# Patient Record
Sex: Female | Born: 1995
Health system: Southern US, Community
[De-identification: ages and names within clinical notes are randomized; demographics above are authoritative.]

## PROBLEM LIST (undated history)

## (undated) DIAGNOSIS — H53009 Unspecified amblyopia, unspecified eye: Secondary | ICD-10-CM

## (undated) DIAGNOSIS — F4325 Adjustment disorder with mixed disturbance of emotions and conduct: Secondary | ICD-10-CM

## (undated) DIAGNOSIS — R6250 Unspecified lack of expected normal physiological development in childhood: Secondary | ICD-10-CM

## (undated) DIAGNOSIS — J309 Allergic rhinitis, unspecified: Secondary | ICD-10-CM

## (undated) DIAGNOSIS — D649 Anemia, unspecified: Secondary | ICD-10-CM

## (undated) DIAGNOSIS — F909 Attention-deficit hyperactivity disorder, unspecified type: Secondary | ICD-10-CM

## (undated) DIAGNOSIS — J4520 Mild intermittent asthma, uncomplicated: Secondary | ICD-10-CM

## (undated) HISTORY — DX: Adjustment disorder with mixed disturbance of emotions and conduct: F43.25

## (undated) HISTORY — DX: Mild intermittent asthma, uncomplicated: J45.20

## (undated) HISTORY — DX: Attention-deficit hyperactivity disorder, unspecified type: F90.9

## (undated) HISTORY — DX: Unspecified lack of expected normal physiological development in childhood: R62.50

## (undated) HISTORY — DX: Allergic rhinitis, unspecified: J30.9

## (undated) HISTORY — DX: Unspecified amblyopia, unspecified eye: H53.009

## (undated) HISTORY — DX: Anemia, unspecified: D64.9

## (undated) HISTORY — PX: NO PAST SURGERIES: SHX2092

---

## 2016-11-11 DIAGNOSIS — F909 Attention-deficit hyperactivity disorder, unspecified type: Secondary | ICD-10-CM | POA: Insufficient documentation

## 2016-11-11 DIAGNOSIS — D649 Anemia, unspecified: Secondary | ICD-10-CM | POA: Insufficient documentation

## 2016-11-11 DIAGNOSIS — J4520 Mild intermittent asthma, uncomplicated: Secondary | ICD-10-CM | POA: Insufficient documentation

## 2016-11-11 DIAGNOSIS — J309 Allergic rhinitis, unspecified: Secondary | ICD-10-CM | POA: Insufficient documentation

## 2016-12-24 ENCOUNTER — Ambulatory Visit: Payer: Self-pay | Admitting: Unknown Physician Specialty

## 2018-06-22 ENCOUNTER — Emergency Department: Payer: Self-pay

## 2018-06-22 ENCOUNTER — Emergency Department
Admission: EM | Admit: 2018-06-22 | Discharge: 2018-06-22 | Disposition: A | Discharge status: Home / Self Care | Payer: Self-pay | Attending: Emergency Medicine | Admitting: Emergency Medicine

## 2018-06-22 ENCOUNTER — Encounter: Payer: Self-pay | Admitting: Emergency Medicine

## 2018-06-22 ENCOUNTER — Other Ambulatory Visit: Payer: Self-pay

## 2018-06-22 DIAGNOSIS — N12 Tubulo-interstitial nephritis, not specified as acute or chronic: Secondary | ICD-10-CM

## 2018-06-22 DIAGNOSIS — N10 Acute tubulo-interstitial nephritis: Secondary | ICD-10-CM | POA: Insufficient documentation

## 2018-06-22 LAB — COMPREHENSIVE METABOLIC PANEL
ALT: 10 U/L (ref 0–44)
ANION GAP: 11 (ref 5–15)
AST: 19 U/L (ref 15–41)
Albumin: 4.5 g/dL (ref 3.5–5.0)
Alkaline Phosphatase: 60 U/L (ref 38–126)
BUN: 25 mg/dL — ABNORMAL HIGH (ref 6–20)
CALCIUM: 9.3 mg/dL (ref 8.9–10.3)
CHLORIDE: 104 mmol/L (ref 98–111)
CO2: 23 mmol/L (ref 22–32)
Creatinine, Ser: 0.75 mg/dL (ref 0.44–1.00)
GFR calc non Af Amer: >60 mL/min (ref >60)
Glucose, Bld: 102 mg/dL — ABNORMAL HIGH (ref 70–99)
Potassium: 3.9 mmol/L (ref 3.5–5.1)
SODIUM: 138 mmol/L (ref 135–145)
Total Bilirubin: 0.4 mg/dL (ref 0.3–1.2)
Total Protein: 7.6 g/dL (ref 6.5–8.1)

## 2018-06-22 LAB — URINALYSIS, COMPLETE (UACMP) WITH MICROSCOPIC
Bilirubin Urine: NEGATIVE
GLUCOSE, UA: NEGATIVE mg/dL
KETONES UR: NEGATIVE mg/dL
Nitrite: POSITIVE — AB
Protein, ur: 30 mg/dL — AB
SPECIFIC GRAVITY, URINE: 1.017 (ref 1.005–1.030)
pH: 5 (ref 5.0–8.0)

## 2018-06-22 LAB — CBC
HCT: 36.4 % (ref 36.0–46.0)
Hemoglobin: 11.2 g/dL — ABNORMAL LOW (ref 12.0–15.0)
MCH: 22.3 pg — AB (ref 26.0–34.0)
MCHC: 30.8 g/dL (ref 30.0–36.0)
MCV: 72.4 fL — ABNORMAL LOW (ref 80.0–100.0)
PLATELETS: 278 10*3/uL (ref 150–400)
RBC: 5.03 MIL/uL (ref 3.87–5.11)
RDW: 15.7 % — ABNORMAL HIGH (ref 11.5–15.5)
WBC: 17.7 10*3/uL — ABNORMAL HIGH (ref 4.0–10.5)
nRBC: 0 % (ref 0.0–0.2)

## 2018-06-22 LAB — INFLUENZA PANEL BY PCR (TYPE A & B)
Influenza A By PCR: NEGATIVE
Influenza B By PCR: NEGATIVE

## 2018-06-22 LAB — POCT PREGNANCY, URINE: PREG TEST UR: NEGATIVE

## 2018-06-22 MED ORDER — CEPHALEXIN 500 MG PO CAPS: 500.0000 mg | ORAL_CAPSULE | Freq: Four times a day (QID) | ORAL | 0 refills | Status: AC

## 2018-06-22 MED ORDER — SODIUM CHLORIDE 0.9 % IV SOLN
1.0000 g | Freq: Once | INTRAVENOUS | Status: AC
  Administered 2018-06-22: 1 g via INTRAVENOUS
  Filled 2018-06-22: qty 10

## 2018-06-22 MED ORDER — SODIUM CHLORIDE 0.9 % IV BOLUS
1000.0000 mL | Freq: Once | INTRAVENOUS | Status: AC
  Administered 2018-06-22: 1000 mL via INTRAVENOUS

## 2018-06-22 MED ORDER — ONDANSETRON 4 MG PO TBDP
ORAL_TABLET | ORAL | Status: AC
  Administered 2018-06-22: 4 mg via ORAL
  Filled 2018-06-22: qty 1

## 2018-06-22 MED ORDER — ONDANSETRON 4 MG PO TBDP
4.0000 mg | ORAL_TABLET | Freq: Once | ORAL | Status: AC
  Administered 2018-06-22: 4 mg via ORAL

## 2018-06-22 MED ORDER — IOPAMIDOL (ISOVUE-300) INJECTION 61%
100.0000 mL | Freq: Once | INTRAVENOUS | Status: AC | PRN
  Administered 2018-06-22: 100 mL via INTRAVENOUS

## 2018-06-22 MED ORDER — IOHEXOL 300 MG/ML  SOLN
30.0000 mL | Freq: Once | INTRAMUSCULAR | Status: AC | PRN
  Administered 2018-06-22: 30 mL via ORAL

## 2018-06-22 NOTE — ED Notes (Signed)
Patient transported to CT 

## 2018-06-22 NOTE — ED Provider Notes (Signed)
[facility] Emergency Department Provider Note   ____________________________________________   First MD Initiated Contact with Patient 06/22/18 [phone]     (approximate)  I have reviewed the triage vital signs and the nursing notes.   HISTORY  Chief Complaint Fever    HPI April Clay is a 22 y.o. female who reports mid back pain with fever developing last night.  Having the back pain.  She vomited once while they were drawing her blood.  She is somewhat tachycardic now.  Patient has a history of kidney infection in the past.  She is having a little bit of right lower quadrant pain on exam.  Pain is about a 4 out of 10.  Seems to be kind of an achy pain.  Both pains are worse with percussion.   Past Medical History:  Diagnosis Date  . ADHD   . Adjustment disorder with mixed disturbance of emotions and conduct    impulsively took pills in context of diagreement with significant other  . Allergic rhinitis   . Amblyopia   . Anemia   . Developmental delay    related to school  . Mild intermittent asthma     Patient Active Problem List   Diagnosis Date Noted  . Allergic rhinitis   . Mild intermittent asthma   . Anemia   . ADHD     History reviewed. No pertinent surgical history.  Prior to Admission medications   Medication Sig Start Date End Date Taking? Authorizing Provider  cephALEXin (KEFLEX) 500 MG capsule Take 1 capsule (500 mg total) by mouth 4 (four) times daily for 10 days. 06/22/18 07/02/18  Arnaldo Natal, MD    Allergies Patient has no known allergies.  Family History  Problem Relation Age of Onset  . Asthma Mother   . Sarcoidosis Mother   . Diabetes Maternal Grandmother   . Hypertension Maternal Grandmother   . Asthma Maternal Grandfather   . Diabetes Paternal Grandmother   . Cancer Paternal Grandfather        prostate    Social History Social History   Tobacco Use  . Smoking status: Not on file  Substance Use Topics    . Alcohol use: Not on file  . Drug use: Not on file    Review of Systems  Constitutional:  fever/chills Eyes: No visual changes. ENT: No sore throat. Cardiovascular: Denies chest pain. Respiratory: Denies shortness of breath. Gastrointestinal:abdominal pain.  No nausea, no vomiting.  No diarrhea.  No constipation. Genitourinary: Negative for dysuria. Musculoskeletal: Negative for back pain. Skin: Negative for rash. Neurological: Negative for headaches, focal weakness   ____________________________________________   PHYSICAL EXAM:  VITAL SIGNS: ED Triage Vitals  Enc Vitals Group     BP 06/22/18 0502 (!) 104/50     Pulse Rate 06/22/18 0504 (!) 115     Resp 06/22/18 0502 20     Temp 06/22/18 0502 98.6 F (37 C)     Temp Source 06/22/18 0502 Oral     SpO2 06/22/18 0502 98 %     Weight 06/22/18 0503 155 lb (70.3 kg)     Height 06/22/18 0503 5\' 8"  (1.727 m)     Head Circumference --      Peak Flow --      Pain Score 06/22/18 0503 7     Pain Loc --      Pain Edu? --      Excl. in GC? --     Constitutional: Alert and  oriented. Well appearing and in no acute distress. Eyes: Conjunctivae are normal.  Head: Atraumatic. Nose: No congestion/rhinnorhea. Mouth/Throat: Mucous membranes are moist.  Oropharynx non-erythematous. Neck: No stridor.  Cardiovascular: Normal rate, regular rhythm. Grossly normal heart sounds.  Good peripheral circulation. Respiratory: Normal respiratory effort.  No retractions. Lungs CTAB. Gastrointestinal: Soft tender to palpation percussion mildly in the right lower quadrant no distention. No abdominal bruits.  Right-sided CVA tenderness. Musculoskeletal: No lower extremity tenderness nor edema.  Neurologic:  Normal speech and language. No gross focal neurologic deficits are appreciated. No gait instability. Skin:  Skin is warm, dry and intact. No rash noted. Psychiatric: Mood and affect are normal. Speech and behavior are  normal.  ____________________________________________   LABS (all labs ordered are listed, but only abnormal results are displayed)  Labs Reviewed  CBC - Abnormal; Notable for the following components:      Result Value   WBC 17.7 (*)    Hemoglobin 11.2 (*)    MCV 72.4 (*)    MCH 22.3 (*)    RDW 15.7 (*)    All other components within normal limits  COMPREHENSIVE METABOLIC PANEL - Abnormal; Notable for the following components:   Glucose, Bld 102 (*)    BUN 25 (*)    All other components within normal limits  URINALYSIS, COMPLETE (UACMP) WITH MICROSCOPIC - Abnormal; Notable for the following components:   Color, Urine YELLOW (*)    APPearance CLOUDY (*)    Hgb urine dipstick SMALL (*)    Protein, ur 30 (*)    Nitrite POSITIVE (*)    Leukocytes, UA LARGE (*)    WBC, UA >50 (*)    Bacteria, UA RARE (*)    Non Squamous Epithelial PRESENT (*)    All other components within normal limits  INFLUENZA PANEL BY PCR (TYPE A & B)  POCT PREGNANCY, URINE   ____________________________________________  EKG   ____________________________________________  RADIOLOGY  ED MD interpretation: Chest x-ray read by radiology reviewed by me is negative.  There is mesenteric adenitis on the CT scan but otherwise nothing  Official radiology report(s): Dg Chest 2 View  Result Date: 06/22/2018 CLINICAL DATA:  22 year old female with back pain. EXAM: CHEST - 2 VIEW COMPARISON:  None. FINDINGS: The heart size and mediastinal contours are within normal limits. Both lungs are clear. The visualized skeletal structures are unremarkable. IMPRESSION: No active cardiopulmonary disease. Electronically Signed   By: Elgie Collard M.D.   On: 06/22/2018 05:50   Ct Abdomen Pelvis W Contrast  Result Date: 06/22/2018 CLINICAL DATA:  Abdominal pain and fever EXAM: CT ABDOMEN AND PELVIS WITH CONTRAST TECHNIQUE: Multidetector CT imaging of the abdomen and pelvis was performed using the standard protocol  following bolus administration of intravenous contrast. Oral contrast was also administered. CONTRAST:  ISOVUE-300 IOPAMIDOL (ISOVUE-300) INJECTION 61% COMPARISON:  None. FINDINGS: Lower chest: Lung bases are clear. Hepatobiliary: No focal liver lesions are apparent. Gallbladder wall is not appreciably thickened. There is no biliary duct dilatation. Pancreas: There is no pancreatic mass or inflammatory focus. Spleen: No splenic lesions are evident. Adrenals/Urinary Tract: Adrenals bilaterally appear unremarkable. Kidneys bilaterally show no evident mass or hydronephrosis on either side. There is no evident renal or ureteral calculus on either side. Urinary bladder is midline with wall thickness within normal limits. Stomach/Bowel: There is moderate stool throughout colon. There is no appreciable bowel wall or mesenteric thickening. There is no evident bowel obstruction. There is no free air or portal venous air. Vascular/Lymphatic:  No abdominal aortic aneurysm. No vascular lesions are evident. There is no appreciable adenopathy in the abdomen or pelvis by size criteria. There are occasional subcentimeter right abdominal lymph nodes. Reproductive: Uterus is anteverted. There is a presumed physiologic follicle in the left ovary measuring 2.4 x 2.0 cm. No other evident pelvic mass. Other: Appendix appears normal. There is no abscess or ascites in the abdomen or pelvis. Musculoskeletal: There are no blastic or lytic bone lesions. There is no intramuscular or abdominal wall lesion evident. IMPRESSION: 1. Appendix appears normal. No bowel obstruction. No abscess in the abdomen pelvis. There is moderate stool throughout the colon. 2. Subcentimeter lymph nodes are noted in the right abdomen. These lymph nodes are considered nonspecific. In the appropriate clinical setting, these lymph nodes potentially may indicate a degree of mesenteric adenitis. 3. No evident renal or ureteral calculus. No hydronephrosis. Urinary  bladder wall thickness normal. Electronically Signed   By: Bretta Bang III M.D.   On: 06/22/2018 09:46    ____________________________________________   PROCEDURES  Procedure(s) performed:   Procedures  Critical Care performed:   ____________________________________________   INITIAL IMPRESSION / ASSESSMENT AND PLAN / ED COURSE  Since the patient has right lower quadrant pain we will go ahead and get a CT scan.  I do not want to miss appendicitis.       ----------------------------------------- 10:14 AM on 06/22/2018 -----------------------------------------  Patient CT only shows mesenteric adenitis this will explain her pain she does have pyelonephritis clinically.  I will treat her with antibiotics and will let her go.  She feels well when I see her just now.   ____________________________________________   FINAL CLINICAL IMPRESSION(S) / ED DIAGNOSES  Final diagnoses:  Pyelonephritis     ED Discharge Orders         Ordered    cephALEXin (KEFLEX) 500 MG capsule  4 times daily     06/22/18 1013           Note:  This document was prepared using Dragon voice recognition software and may include unintentional dictation errors.    Arnaldo Natal, MD 06/22/18 1014

## 2018-06-22 NOTE — ED Triage Notes (Signed)
Pt reports at 3am she started shivering and thinks she was running a fever. Pt also reports pain in her back. Pt denies urinary sx's, cough, congestion or other upper respiratory sx's.

## 2018-08-25 NOTE — L&D Delivery Note (Signed)
Date of delivery: 04/17/2019 Estimated Date of Delivery: 04/10/19 Patient's last menstrual period was 06/24/2018. EGA: [redacted]w[redacted]d  Delivery Note At 5:06 AM a viable female was delivered via Vaginal, Spontaneous (Presentation: OA;  LOA).  APGAR: 8, 9; weight 7 lb 15 oz (3600 g).   Placenta status: spontaneous, intact.  Cord:  with the following complications: none.  Cord pH: NA  Called to see patient.  Mom pushed to deliver a viable female infant.  The head followed by shoulders, which delivered without difficulty, and the rest of the body.  Nuchal cord noted and reduced on the perineum.  Baby to mom's chest.  Cord clamped and cut after 3 min delay.  No cord blood obtained.  Placenta delivered spontaneously, intact, with a 3-vessel cord.   All counts correct.  Hemostasis obtained with IV pitocin and fundal massage.   Anesthesia: epidural  Episiotomy: None Lacerations: None Suture Repair: none Est. Blood Loss (mL): 150  Mom to postpartum.  Baby to Couplet care / Skin to Skin.  Rod Can, CNM 04/17/2019, 5:42 AM

## 2018-10-13 ENCOUNTER — Other Ambulatory Visit (HOSPITAL_COMMUNITY)
Admission: RE | Admit: 2018-10-13 | Discharge: 2018-10-13 | Disposition: A | Discharge status: Home / Self Care | Payer: Medicaid Other | Source: Ambulatory Visit | Attending: Obstetrics & Gynecology | Admitting: Obstetrics & Gynecology

## 2018-10-13 ENCOUNTER — Ambulatory Visit (INDEPENDENT_AMBULATORY_CARE_PROVIDER_SITE_OTHER): Payer: Medicaid Other | Admitting: Obstetrics & Gynecology

## 2018-10-13 ENCOUNTER — Encounter: Payer: Self-pay | Admitting: Obstetrics & Gynecology

## 2018-10-13 VITALS — BP 120/60 | Wt 156.0 lb

## 2018-10-13 DIAGNOSIS — Z3482 Encounter for supervision of other normal pregnancy, second trimester: Secondary | ICD-10-CM | POA: Diagnosis not present

## 2018-10-13 DIAGNOSIS — Z3A15 15 weeks gestation of pregnancy: Secondary | ICD-10-CM

## 2018-10-13 DIAGNOSIS — Z113 Encounter for screening for infections with a predominantly sexual mode of transmission: Secondary | ICD-10-CM | POA: Insufficient documentation

## 2018-10-13 DIAGNOSIS — N926 Irregular menstruation, unspecified: Secondary | ICD-10-CM

## 2018-10-13 DIAGNOSIS — Z124 Encounter for screening for malignant neoplasm of cervix: Secondary | ICD-10-CM

## 2018-10-13 MED ORDER — VITAFOL GUMMIES 3.33-0.333-34.8 MG PO CHEW: 1.0000 | CHEWABLE_TABLET | Freq: Two times a day (BID) | ORAL | 8 refills | Status: AC

## 2018-10-13 NOTE — Progress Notes (Signed)
10/13/2018   Chief Complaint: Missed period  Transfer of Care Patient: no  History of Present Illness: Ms. April Clay is a 23 y.o. [id number] [redacted]w[redacted]d based on Patient's last menstrual period was 06/24/2018. with an Estimated Date of Delivery: 03/31/19, with the above CC.   Her periods were: irregular periods.  Thinks she is 13 weeks.  Did not have nausea or bleeding or pain She was using no method when she conceived.  She has Negative signs or symptoms of nausea/vomiting of pregnancy. She has Negative signs or symptoms of miscarriage or preterm labor She identifies Negative Zika risk factors for her and her partner On any different medications around the time she conceived/early pregnancy: No  History of varicella: Yes   ROS: A 12-point review of systems was performed and negative, except as stated in the above HPI.  OBGYN History: As per HPI. OB History  Gravida Para Term Preterm AB Living  3 2 2     2   SAB TAB Ectopic Multiple Live Births               # Outcome Date GA Lbr Len/2nd Weight Sex Delivery Anes PTL Lv  3 Current           2 Term 06/12/17 [redacted]w[redacted]d  8 lb 9.6 oz (3.901 kg) M Vag-Spont     1 Term 01/23/15 [redacted]w[redacted]d  8 lb 8 oz (3.856 kg) M Vag-Spont       Any issues with any prior pregnancies: no Any prior children are healthy, doing well, without any problems or issues: yes History of pap smears: No.    Past Medical History: Past Medical History:  Diagnosis Date  . ADHD   . Adjustment disorder with mixed disturbance of emotions and conduct    impulsively took pills in context of diagreement with significant other  . Allergic rhinitis   . Amblyopia   . Anemia   . Developmental delay    related to school  . Mild intermittent asthma     Past Surgical History: History reviewed. No pertinent surgical history.  Family History:  Family History  Problem Relation Age of Onset  . Asthma Mother   . Sarcoidosis Mother   . Diabetes Maternal Grandmother   . Hypertension Maternal  Grandmother   . Asthma Maternal Grandfather   . Diabetes Paternal Grandmother   . Cancer Paternal Grandfather        prostate   She denies any female cancers, bleeding or blood clotting disorders.  She denies any history of mental retardation, birth defects or genetic disorders in her or the FOB's history  Social History:  Social History   Socioeconomic History  . Marital status: Single    Spouse name: Not on file  . Number of children: Not on file  . Years of education: Not on file  . Highest education level: Not on file  Occupational History  . Not on file  Social Needs  . Financial resource strain: Not on file  . Food insecurity:    Worry: Not on file    Inability: Not on file  . Transportation needs:    Medical: Not on file    Non-medical: Not on file  Tobacco Use  . Smoking status: Never Smoker  . Smokeless tobacco: Never Used  Substance and Sexual Activity  . Alcohol use: Never    Frequency: Never  . Drug use: Never  . Sexual activity: Yes    Birth control/protection: None  Lifestyle  . Physical activity:  Days per week: Not on file    Minutes per session: Not on file  . Stress: Not on file  Relationships  . Social connections:    Talks on phone: Not on file    Gets together: Not on file    Attends religious service: Not on file    Active member of club or organization: Not on file    Attends meetings of clubs or organizations: Not on file    Relationship status: Not on file  . Intimate partner violence:    Fear of current or ex partner: Not on file    Emotionally abused: Not on file    Physically abused: Not on file    Forced sexual activity: Not on file  Other Topics Concern  . Not on file  Social History Narrative  . Not on file   Any pets in the household: no  Allergy: No Known Allergies  Current Outpatient Medications:  Current Outpatient Medications:  .  Prenatal Vit-Fe Phos-FA-Omega (VITAFOL GUMMIES) 3.33-0.333-34.8 MG CHEW, Chew 1  tablet by mouth 2 (two) times daily., Disp: 60 tablet, Rfl: 8   Physical Exam:   BP 120/60   Wt 156 lb (70.8 kg)   LMP 06/24/2018 Comment: neg preg test 06/22/18  BMI 23.72 kg/m  Body mass index is 23.72 kg/m. Constitutional: Well nourished, well developed female in no acute distress.  Neck:  Supple, normal appearance, and no thyromegaly  Cardiovascular: S1, S2 normal, no murmur, rub or gallop, regular rate and rhythm Respiratory:  Clear to auscultation bilateral. Normal respiratory effort Abdomen: positive bowel sounds and no masses, hernias; diffusely non tender to palpation, non distended Breasts: breasts appear normal, no suspicious masses, no skin or nipple changes or axillary nodes. Neuro/Psych:  Normal mood and affect.  Skin:  Warm and dry.  Lymphatic:  No inguinal lymphadenopathy.   Pelvic exam: is not limited by body habitus EGBUS: within normal limits, Vagina: within normal limits and with no blood in the vault, Cervix: normal appearing cervix without discharge or lesions, closed/long/high, Uterus:  enlarged: 13 weeks, and Adnexa:  not evaluated  Assessment: Ms. April Clay is a 23 y.o. [id number] [redacted]w[redacted]d based on Patient's last menstrual period was 06/24/2018. with an Estimated Date of Delivery: 03/31/19,  for prenatal care.  Plan:  1) Avoid alcoholic beverages. 2) Patient encouraged not to smoke.  3) Discontinue the use of all non-medicinal drugs and chemicals.  4) Take prenatal vitamins daily.  5) Seatbelt use advised 6) Nutrition, food safety (fish, cheese advisories, and high nitrite foods) and exercise discussed. 7) Hospital and practice style delivering at [facility] discussed  8) Patient is asked about travel to areas at risk for the Zika virus, and counseled to avoid travel and exposure to mosquitoes or sexual partners who may have themselves been exposed to the virus. Testing is discussed, and will be ordered as appropriate.  9) Childbirth classes at [facility] advised 10) Genetic  Screening, such as with 1st Trimester Screening, cell free fetal DNA, AFP testing, and Ultrasound, as well as with amniocentesis and CVS as appropriate, is discussed with patient. She plans to have not genetic testing this pregnancy.  Problem list reviewed and updated.  Annamarie Major, MD, [facility], [facility] 10/13/2018  2:51 PM

## 2018-10-13 NOTE — Patient Instructions (Signed)
First Trimester of Pregnancy  The first trimester of pregnancy is from week 1 until the end of week 13 (months 1 through 3). A week after a sperm fertilizes an egg, the egg will implant on the wall of the uterus. This embryo will begin to develop into a baby. Genes from you and your partner will form the baby. The female genes will determine whether the baby will be a boy or a girl. At 6-8 weeks, the eyes and face will be formed, and the heartbeat can be seen on ultrasound. At the end of 12 weeks, all the baby's organs will be formed.  Now that you are pregnant, you will want to do everything you can to have a healthy baby. Two of the most important things are to get good prenatal care and to follow your health care provider's instructions. Prenatal care is all the medical care you receive before the baby's birth. This care will help prevent, find, and treat any problems during the pregnancy and childbirth.  Body changes during your first trimester  Your body goes through many changes during pregnancy. The changes vary from woman to woman.   You may gain or lose a couple of pounds at first.   You may feel sick to your stomach (nauseous) and you may throw up (vomit). If the vomiting is uncontrollable, call your health care provider.   You may tire easily.   You may develop headaches that can be relieved by medicines. All medicines should be approved by your health care provider.   You may urinate more often. Painful urination may mean you have a bladder infection.   You may develop heartburn as a result of your pregnancy.   You may develop constipation because certain hormones are causing the muscles that push stool through your intestines to slow down.   You may develop hemorrhoids or swollen veins (varicose veins).   Your breasts may begin to grow larger and become tender. Your nipples may stick out more, and the tissue that surrounds them (areola) may become darker.   Your gums may bleed and may be  sensitive to brushing and flossing.   Dark spots or blotches (chloasma, mask of pregnancy) may develop on your face. This will likely fade after the baby is born.   Your menstrual periods will stop.   You may have a loss of appetite.   You may develop cravings for certain kinds of food.   You may have changes in your emotions from day to day, such as being excited to be pregnant or being concerned that something may go wrong with the pregnancy and baby.   You may have more vivid and strange dreams.   You may have changes in your hair. These can include thickening of your hair, rapid growth, and changes in texture. Some women also have hair loss during or after pregnancy, or hair that feels dry or thin. Your hair will most likely return to normal after your baby is born.  What to expect at prenatal visits  During a routine prenatal visit:   You will be weighed to make sure you and the baby are growing normally.   Your blood pressure will be taken.   Your abdomen will be measured to track your baby's growth.   The fetal heartbeat will be listened to between weeks 10 and 14 of your pregnancy.   Test results from any previous visits will be discussed.  Your health care provider may ask you:     How you are feeling.   If you are feeling the baby move.   If you have had any abnormal symptoms, such as leaking fluid, bleeding, severe headaches, or abdominal cramping.   If you are using any tobacco products, including cigarettes, chewing tobacco, and electronic cigarettes.   If you have any questions.  Other tests that may be performed during your first trimester include:   Blood tests to find your blood type and to check for the presence of any previous infections. The tests will also be used to check for low iron levels (anemia) and protein on red blood cells (Rh antibodies). Depending on your risk factors, or if you previously had diabetes during pregnancy, you may have tests to check for high blood sugar  that affects pregnant women (gestational diabetes).   Urine tests to check for infections, diabetes, or protein in the urine.   An ultrasound to confirm the proper growth and development of the baby.   Fetal screens for spinal cord problems (spina bifida) and Down syndrome.   HIV (human immunodeficiency virus) testing. Routine prenatal testing includes screening for HIV, unless you choose not to have this test.   You may need other tests to make sure you and the baby are doing well.  Follow these instructions at home:  Medicines   Follow your health care provider's instructions regarding medicine use. Specific medicines may be either safe or unsafe to take during pregnancy.   Take a prenatal vitamin that contains at least 600 micrograms (mcg) of folic acid.   If you develop constipation, try taking a stool softener if your health care provider approves.  Eating and drinking     Eat a balanced diet that includes fresh fruits and vegetables, whole grains, good sources of protein such as meat, eggs, or tofu, and low-fat dairy. Your health care provider will help you determine the amount of weight gain that is right for you.   Avoid raw meat and uncooked cheese. These carry germs that can cause birth defects in the baby.   Eating four or five small meals rather than three large meals a day may help relieve nausea and vomiting. If you start to feel nauseous, eating a few soda crackers can be helpful. Drinking liquids between meals, instead of during meals, also seems to help ease nausea and vomiting.   Limit foods that are high in fat and processed sugars, such as fried and sweet foods.   To prevent constipation:  ? Eat foods that are high in fiber, such as fresh fruits and vegetables, whole grains, and beans.  ? Drink enough fluid to keep your urine clear or pale yellow.  Activity   Exercise only as directed by your health care provider. Most women can continue their usual exercise routine during  pregnancy. Try to exercise for 30 minutes at least 5 days a week. Exercising will help you:  ? Control your weight.  ? Stay in shape.  ? Be prepared for labor and delivery.   Experiencing pain or cramping in the lower abdomen or lower back is a good sign that you should stop exercising. Check with your health care provider before continuing with normal exercises.   Try to avoid standing for long periods of time. Move your legs often if you must stand in one place for a long time.   Avoid heavy lifting.   Wear low-heeled shoes and practice good posture.   You may continue to have sex unless your health care   provider tells you not to.  Relieving pain and discomfort   Wear a good support bra to relieve breast tenderness.   Take warm sitz baths to soothe any pain or discomfort caused by hemorrhoids. Use hemorrhoid cream if your health care provider approves.   Rest with your legs elevated if you have leg cramps or low back pain.   If you develop varicose veins in your legs, wear support hose. Elevate your feet for 15 minutes, 3-4 times a day. Limit salt in your diet.  Prenatal care   Schedule your prenatal visits by the twelfth week of pregnancy. They are usually scheduled monthly at first, then more often in the last 2 months before delivery.   Write down your questions. Take them to your prenatal visits.   Keep all your prenatal visits as told by your health care provider. This is important.  Safety   Wear your seat belt at all times when driving.   Make a list of emergency phone numbers, including numbers for family, friends, the hospital, and police and fire departments.  General instructions   Ask your health care provider for a referral to a local prenatal education class. Begin classes no later than the beginning of month 6 of your pregnancy.   Ask for help if you have counseling or nutritional needs during pregnancy. Your health care provider can offer advice or refer you to specialists for help  with various needs.   Do not use hot tubs, steam rooms, or saunas.   Do not douche or use tampons or scented sanitary pads.   Do not cross your legs for long periods of time.   Avoid cat litter boxes and soil used by cats. These carry germs that can cause birth defects in the baby and possibly loss of the fetus by miscarriage or stillbirth.   Avoid all smoking, herbs, alcohol, and medicines not prescribed by your health care provider. Chemicals in these products affect the formation and growth of the baby.   Do not use any products that contain nicotine or tobacco, such as cigarettes and e-cigarettes. If you need help quitting, ask your health care provider. You may receive counseling support and other resources to help you quit.   Schedule a dentist appointment. At home, brush your teeth with a soft toothbrush and be gentle when you floss.  Contact a health care provider if:   You have dizziness.   You have mild pelvic cramps, pelvic pressure, or nagging pain in the abdominal area.   You have persistent nausea, vomiting, or diarrhea.   You have a bad smelling vaginal discharge.   You have pain when you urinate.   You notice increased swelling in your face, hands, legs, or ankles.   You are exposed to fifth disease or chickenpox.   You are exposed to German measles (rubella) and have never had it.  Get help right away if:   You have a fever.   You are leaking fluid from your vagina.   You have spotting or bleeding from your vagina.   You have severe abdominal cramping or pain.   You have rapid weight gain or loss.   You vomit blood or material that looks like coffee grounds.   You develop a severe headache.   You have shortness of breath.   You have any kind of trauma, such as from a fall or a car accident.  Summary   The first trimester of pregnancy is from week 1 until   the end of week 13 (months 1 through 3).   Your body goes through many changes during pregnancy. The changes vary from  woman to woman.   You will have routine prenatal visits. During those visits, your health care provider will examine you, discuss any test results you may have, and talk with you about how you are feeling.  This information is not intended to replace advice given to you by your health care provider. Make sure you discuss any questions you have with your health care provider.  Document Released: 08/05/2001 Document Revised: 07/23/2016 Document Reviewed: 07/23/2016  [employer]vier Interactive Patient Education  2019 [employer]

## 2018-10-14 LAB — DRUG SCREEN, URINE
Amphetamines, Urine: NEGATIVE ng/mL
BARBITURATE SCREEN URINE: NEGATIVE ng/mL
Benzodiazepine Quant, Ur: NEGATIVE ng/mL
CANNABINOID QUANT UR: NEGATIVE ng/mL
Cocaine (Metab.): NEGATIVE ng/mL
Opiate Quant, Ur: NEGATIVE ng/mL
PCP Quant, Ur: NEGATIVE ng/mL

## 2018-10-15 LAB — RPR+RH+ABO+RUB AB+AB SCR+CB...
ANTIBODY SCREEN: NEGATIVE
HEMATOCRIT: 34.3 % (ref 34.0–46.6)
HEMOGLOBIN: 10.8 g/dL — AB (ref 11.1–15.9)
HIV Screen 4th Generation wRfx: NONREACTIVE
Hepatitis B Surface Ag: NEGATIVE
MCH: 23.1 pg — AB (ref 26.6–33.0)
MCHC: 31.5 g/dL (ref 31.5–35.7)
MCV: 73 fL — AB (ref 79–97)
Platelets: 396 10*3/uL (ref 150–450)
RBC: 4.68 x10E6/uL (ref 3.77–5.28)
RDW: 16.2 % — ABNORMAL HIGH (ref 11.7–15.4)
RH TYPE: POSITIVE
RPR Ser Ql: NONREACTIVE
Rubella Antibodies, IGG: 14.2 index (ref >0.99)
Varicella zoster IgG: 169 index (ref >165)
WBC: 6.9 10*3/uL (ref 3.4–10.8)

## 2018-10-15 LAB — HEMOGLOBINOPATHY EVALUATION
HGB C: 0 %
HGB S: 0 %
HGB VARIANT: 0 %
Hemoglobin A2 Quantitation: 2 % (ref 1.8–3.2)
Hemoglobin F Quantitation: 0 % (ref 0.0–2.0)
Hgb A: 98 % (ref 96.4–98.8)

## 2018-10-15 LAB — CYTOLOGY - PAP
Chlamydia: NEGATIVE
DIAGNOSIS: NEGATIVE
Neisseria Gonorrhea: NEGATIVE

## 2018-10-15 LAB — URINE CULTURE

## 2018-10-20 ENCOUNTER — Other Ambulatory Visit: Payer: Self-pay | Admitting: Obstetrics & Gynecology

## 2018-10-20 ENCOUNTER — Ambulatory Visit (INDEPENDENT_AMBULATORY_CARE_PROVIDER_SITE_OTHER): Payer: Medicaid Other | Admitting: Maternal Newborn

## 2018-10-20 ENCOUNTER — Encounter: Payer: Self-pay | Admitting: Maternal Newborn

## 2018-10-20 ENCOUNTER — Ambulatory Visit (INDEPENDENT_AMBULATORY_CARE_PROVIDER_SITE_OTHER): Payer: Medicaid Other

## 2018-10-20 VITALS — BP 122/74 | Wt 157.0 lb

## 2018-10-20 DIAGNOSIS — Z3689 Encounter for other specified antenatal screening: Secondary | ICD-10-CM

## 2018-10-20 DIAGNOSIS — B373 Candidiasis of vulva and vagina: Secondary | ICD-10-CM

## 2018-10-20 DIAGNOSIS — N926 Irregular menstruation, unspecified: Secondary | ICD-10-CM

## 2018-10-20 DIAGNOSIS — Z3687 Encounter for antenatal screening for uncertain dates: Secondary | ICD-10-CM

## 2018-10-20 DIAGNOSIS — Z3480 Encounter for supervision of other normal pregnancy, unspecified trimester: Secondary | ICD-10-CM | POA: Insufficient documentation

## 2018-10-20 DIAGNOSIS — Z3A15 15 weeks gestation of pregnancy: Secondary | ICD-10-CM

## 2018-10-20 DIAGNOSIS — B3731 Acute candidiasis of vulva and vagina: Secondary | ICD-10-CM

## 2018-10-20 DIAGNOSIS — Z3482 Encounter for supervision of other normal pregnancy, second trimester: Secondary | ICD-10-CM

## 2018-10-20 MED ORDER — FLUCONAZOLE 150 MG PO TABS: 150.0000 mg | ORAL_TABLET | Freq: Once | ORAL | 0 refills | Status: AC

## 2018-10-20 NOTE — Progress Notes (Signed)
    Routine Prenatal Care Visit  Subjective  April Clay is a 23 y.o. G3P2002 at [redacted]w[redacted]d being seen today for ongoing prenatal care.  She is currently monitored for the following issues for this low-risk pregnancy and has Allergic rhinitis; Mild intermittent asthma; Anemia; ADHD; and Supervision of other normal pregnancy, antepartum on their problem list.  ----------------------------------------------------------------------------------- Patient reports headache.   Vag. Bleeding: None.   ----------------------------------------------------------------------------------- The following portions of the patient's history were reviewed and updated as appropriate: allergies, current medications, past family history, past medical history, past social history, past surgical history and problem list. Problem list updated.  Objective  Blood pressure 122/74, weight 157 lb (71.2 kg), last menstrual period 06/24/2018. Pregravid weight Pregravid weight not on file Total Weight Gain Not found.  Fetal Status: Fetal Heart Rate (bpm): 146         General:  Alert, oriented and cooperative. Patient is in no acute distress.  Skin: Skin is warm and dry. No rash noted.   Cardiovascular: Normal heart rate noted  Respiratory: Normal respiratory effort, no problems with respiration noted  Abdomen: Soft, gravid, appropriate for gestational age. Pain/Pressure: Absent     Pelvic:  Cervical exam deferred        Extremities: Normal range of motion.     Mental Status: Normal mood and affect. Normal behavior. Normal judgment and thought content.    Assessment   23 y.o. [id number] at [redacted]w[redacted]d, EDD 04/10/2019 by Ultrasound presenting for a routine prenatal visit.  Plan   pregnancy3 Problems (from 06/24/18 to present)    Problem Noted Resolved   Supervision of other normal pregnancy, antepartum 10/20/2018 by Oswaldo Conroy, CNM No   Overview Signed 10/20/2018 11:22 AM by Oswaldo Conroy, CNM    [facility] Prenatal  Labs  Dating  Blood type: A/Positive/-- (02/19 1502)   Genetic Screen 1 Screen:    AFP:     Quad:     NIPS: Antibody:Negative (02/19 1502)  Anatomic [place]  Rubella: 14.20 (02/19 1502) Varicella: Immune  GTT Early:               Third trimester:  RPR: Non Reactive (02/19 1502)   Rhogam  HBsAg: Negative (02/19 1502)   TDaP vaccine                       Flu Shot: HIV: Non Reactive (02/19 1502)   Baby Food                                GBS:   Contraception  Pap: 10/13/2018, NILM  CBB     CS/VBAC    Support Person               Dating scan today shows singleton IUP, FHR 146, at [redacted]w[redacted]d.  EDD will be changed to Ultrasound EDD of 04/10/2019  Discussed lab results. Rx sent for yeast seen on Pap as she is having some thick discharge. Encouraged taking prenatal vitamins with Fe for mild anemia.  Advised Tylenol for headache.  Please refer to After Visit Summary for other counseling recommendations.   Return in about 4 weeks (around 11/17/2018) for ROB and anatomy scan .  Marcelyn Bruins, CNM 10/20/2018

## 2018-10-20 NOTE — Progress Notes (Signed)
No vb. No lof. U/s today.  

## 2018-10-21 ENCOUNTER — Telehealth: Payer: Self-pay | Admitting: Advanced Practice Midwife

## 2018-10-21 NOTE — Telephone Encounter (Signed)
Patient's next appt: Thursday, 11/18/18 @ 10:30am ultrasound and 11:10am rob w/ Erskine Squibb. No answer, no v/m. No Mychart.

## 2018-10-22 NOTE — Patient Instructions (Signed)

## 2018-11-02 NOTE — Telephone Encounter (Signed)
Patient is aware of appointment °

## 2018-11-18 ENCOUNTER — Ambulatory Visit (INDEPENDENT_AMBULATORY_CARE_PROVIDER_SITE_OTHER): Payer: Medicaid Other | Admitting: Advanced Practice Midwife

## 2018-11-18 ENCOUNTER — Other Ambulatory Visit: Payer: Self-pay

## 2018-11-18 ENCOUNTER — Ambulatory Visit (INDEPENDENT_AMBULATORY_CARE_PROVIDER_SITE_OTHER): Payer: Medicaid Other

## 2018-11-18 VITALS — BP 118/76 | Wt 162.0 lb

## 2018-11-18 DIAGNOSIS — Z130 Encounter for screening for diseases of the blood and blood-forming organs and certain disorders involving the immune mechanism: Secondary | ICD-10-CM

## 2018-11-18 DIAGNOSIS — Z363 Encounter for antenatal screening for malformations: Secondary | ICD-10-CM | POA: Diagnosis not present

## 2018-11-18 DIAGNOSIS — O99519 Diseases of the respiratory system complicating pregnancy, unspecified trimester: Secondary | ICD-10-CM

## 2018-11-18 DIAGNOSIS — Z113 Encounter for screening for infections with a predominantly sexual mode of transmission: Secondary | ICD-10-CM

## 2018-11-18 DIAGNOSIS — Z3689 Encounter for other specified antenatal screening: Secondary | ICD-10-CM

## 2018-11-18 DIAGNOSIS — O99512 Diseases of the respiratory system complicating pregnancy, second trimester: Secondary | ICD-10-CM

## 2018-11-18 DIAGNOSIS — O26892 Other specified pregnancy related conditions, second trimester: Secondary | ICD-10-CM

## 2018-11-18 DIAGNOSIS — Z3A19 19 weeks gestation of pregnancy: Secondary | ICD-10-CM

## 2018-11-18 DIAGNOSIS — J45909 Unspecified asthma, uncomplicated: Secondary | ICD-10-CM

## 2018-11-18 DIAGNOSIS — Z3480 Encounter for supervision of other normal pregnancy, unspecified trimester: Secondary | ICD-10-CM

## 2018-11-18 DIAGNOSIS — Z131 Encounter for screening for diabetes mellitus: Secondary | ICD-10-CM

## 2018-11-18 DIAGNOSIS — R51 Headache: Secondary | ICD-10-CM

## 2018-11-18 MED ORDER — ALBUTEROL SULFATE HFA 108 (90 BASE) MCG/ACT IN AERS: 1.0000 | INHALATION_SPRAY | Freq: Four times a day (QID) | RESPIRATORY_TRACT | 3 refills | Status: DC | PRN

## 2018-11-18 MED ORDER — BUTALBITAL-APAP-CAFFEINE 50-325-40 MG PO CAPS: 1.0000 | ORAL_CAPSULE | Freq: Four times a day (QID) | ORAL | 0 refills | Status: DC | PRN

## 2018-11-18 NOTE — Progress Notes (Signed)
No vb. No lof.  Pt having HA's frequently. U/s today.

## 2018-11-18 NOTE — Progress Notes (Signed)
Routine Prenatal Care Visit  Subjective  April Clay is a 23 y.o. G3P2002 at [redacted]w[redacted]d being seen today for ongoing prenatal care.  She is currently monitored for the following issues for this low-risk pregnancy and has Allergic rhinitis; Mild intermittent asthma; Anemia; ADHD; and Supervision of other normal pregnancy, antepartum on their problem list.  ----------------------------------------------------------------------------------- Patient reports headaches since the beginning of the pregnancy. She admits stopping soda intake at the beginning of the pregnancy. She requests refill of inhaler as her past prescription has expired and she has no refills. She uses it occasionally.    . Vag. Bleeding: None.  Movement: Present. Denies leaking of fluid.  ----------------------------------------------------------------------------------- The following portions of the patient's history were reviewed and updated as appropriate: allergies, current medications, past family history, past medical history, past social history, past surgical history and problem list. Problem list updated.   Objective  Blood pressure 118/76, weight 162 lb (73.5 kg), last menstrual period 06/24/2018. Pregravid weight 155 lb (70.3 kg) Total Weight Gain 7 lb (3.175 kg) Urinalysis: Urine Protein    Urine Glucose    Fetal Status: Fetal Heart Rate (bpm): 143   Movement: Present     Anatomy scan incomplete spine and LVOT, anterior placenta, female Choroid plexus seen Right: 0.9 x 0.6, Left: 0.8 x 0.6  General:  Alert, oriented and cooperative. Patient is in no acute distress.  Skin: Skin is warm and dry. No rash noted.   Cardiovascular: Normal heart rate noted  Respiratory: Normal respiratory effort, no problems with respiration noted  Abdomen: Soft, gravid, appropriate for gestational age. Pain/Pressure: Absent     Pelvic:  Cervical exam deferred        Extremities: Normal range of motion.     Mental Status: Normal mood and  affect. Normal behavior. Normal judgment and thought content.   Assessment   23 y.o. [id number] at [redacted]w[redacted]d by  04/10/2019, by Ultrasound presenting for routine prenatal visit  Plan   pregnancy3 Problems (from 06/24/18 to present)    Problem Noted Resolved   Supervision of other normal pregnancy, antepartum 10/20/2018 by Oswaldo Conroy, CNM No   Overview Signed 10/20/2018 11:22 AM by Oswaldo Conroy, CNM    [facility] Prenatal Labs  Dating  Blood type: A/Positive/-- (02/19 1502)   Genetic Screen 1 Screen:    AFP:     Quad:     NIPS: Antibody:Negative (02/19 1502)  Anatomic [place]  Rubella: 14.20 (02/19 1502) Varicella: Immune  GTT Early:               Third trimester:  RPR: Non Reactive (02/19 1502)   Rhogam  HBsAg: Negative (02/19 1502)   TDaP vaccine                       Flu Shot: HIV: Non Reactive (02/19 1502)   Baby Food                                GBS:   Contraception  Pap: 10/13/2018, NILM  CBB     CS/VBAC    Support Person                  Preterm labor symptoms and general obstetric precautions including but not limited to vaginal bleeding, contractions, leaking of fluid and fetal movement were reviewed in detail with the patient.   Rx albuterol Try adding a cup of caffeineated beverage  daily (avoid sugar sweetened) prior to trying Fioricet  Return in about 8 weeks (around 01/13/2019) for follow up anatomy, 28 wk labs, ROB.    Tresea Mall, CNM 11/18/2018 11:38 AM

## 2019-01-13 ENCOUNTER — Ambulatory Visit (INDEPENDENT_AMBULATORY_CARE_PROVIDER_SITE_OTHER): Payer: Medicaid Other

## 2019-01-13 ENCOUNTER — Ambulatory Visit (INDEPENDENT_AMBULATORY_CARE_PROVIDER_SITE_OTHER): Payer: Medicaid Other | Admitting: Maternal Newborn

## 2019-01-13 ENCOUNTER — Other Ambulatory Visit: Payer: Medicaid Other

## 2019-01-13 ENCOUNTER — Encounter: Payer: Self-pay | Admitting: Maternal Newborn

## 2019-01-13 ENCOUNTER — Other Ambulatory Visit: Payer: Self-pay

## 2019-01-13 ENCOUNTER — Encounter: Payer: Medicaid Other | Admitting: Advanced Practice Midwife

## 2019-01-13 VITALS — BP 130/60 | Wt 174.0 lb

## 2019-01-13 DIAGNOSIS — Z3480 Encounter for supervision of other normal pregnancy, unspecified trimester: Secondary | ICD-10-CM

## 2019-01-13 DIAGNOSIS — Z113 Encounter for screening for infections with a predominantly sexual mode of transmission: Secondary | ICD-10-CM

## 2019-01-13 DIAGNOSIS — Z130 Encounter for screening for diseases of the blood and blood-forming organs and certain disorders involving the immune mechanism: Secondary | ICD-10-CM

## 2019-01-13 DIAGNOSIS — O0932 Supervision of pregnancy with insufficient antenatal care, second trimester: Secondary | ICD-10-CM

## 2019-01-13 DIAGNOSIS — Z362 Encounter for other antenatal screening follow-up: Secondary | ICD-10-CM | POA: Diagnosis not present

## 2019-01-13 DIAGNOSIS — Z131 Encounter for screening for diabetes mellitus: Secondary | ICD-10-CM

## 2019-01-13 DIAGNOSIS — O0930 Supervision of pregnancy with insufficient antenatal care, unspecified trimester: Secondary | ICD-10-CM

## 2019-01-13 DIAGNOSIS — Z369 Encounter for antenatal screening, unspecified: Secondary | ICD-10-CM

## 2019-01-13 DIAGNOSIS — Z3A27 27 weeks gestation of pregnancy: Secondary | ICD-10-CM

## 2019-01-13 LAB — POCT URINALYSIS DIPSTICK OB
Glucose, UA: NEGATIVE
POC,PROTEIN,UA: NEGATIVE

## 2019-01-13 NOTE — Progress Notes (Signed)
Follow up Anatomy/ROB/1 hr GTT- no concerns

## 2019-01-13 NOTE — Progress Notes (Signed)
    Routine Prenatal Care Visit  Subjective  April Clay is a 23 y.o. G3P2002 at [redacted]w[redacted]d being seen today for ongoing prenatal care.  She is currently monitored for the following issues for this low-risk pregnancy and has Allergic rhinitis; Mild intermittent asthma; Anemia; ADHD; Supervision of other normal pregnancy, antepartum; and Limited prenatal care, antepartum on their problem list.  ----------------------------------------------------------------------------------- Patient reports no complaints.   Contractions: Not present. Vag. Bleeding: None.  Movement: Present. No leaking of fluid.  ----------------------------------------------------------------------------------- The following portions of the patient's history were reviewed and updated as appropriate: allergies, current medications, past family history, past medical history, past social history, past surgical history and problem list. Problem list updated.  Objective  Blood pressure 130/60, weight 174 lb (78.9 kg), last menstrual period 06/24/2018. Pregravid weight 155 lb (70.3 kg) Total Weight Gain 19 lb (8.618 kg) Urinalysis: Urine dipstick shows negative for glucose, protein. Fetal Status: Fetal Heart Rate (bpm): 141   Movement: Present     General:  Alert, oriented and cooperative. Patient is in no acute distress.  Skin: Skin is warm and dry. No rash noted.   Cardiovascular: Normal heart rate noted  Respiratory: Normal respiratory effort, no problems with respiration noted  Abdomen: Soft, gravid, appropriate for gestational age. Pain/Pressure: Absent     Pelvic:  Cervical exam deferred        Extremities: Normal range of motion.     Mental Status: Normal mood and affect. Normal behavior. Normal judgment and thought content.     Assessment   23 y.o. [id number] at [redacted]w[redacted]d, EDD 04/10/2019 by Ultrasound presenting for a routine prenatal visit.  Plan   pregnancy3 Problems (from 06/24/18 to present)    Problem Noted Resolved   Supervision of other normal pregnancy, antepartum 10/20/2018 by Oswaldo Conroy, CNM No   Overview Signed 10/20/2018 11:22 AM by Oswaldo Conroy, CNM    [facility] Prenatal Labs  Dating  Blood type: A/Positive/-- (02/19 1502)   Genetic Screen 1 Screen:    AFP:     Quad:     NIPS: Antibody:Negative (02/19 1502)  Anatomic [place]  Rubella: 14.20 (02/19 1502) Varicella: Immune  GTT Early:               Third trimester:  RPR: Non Reactive (02/19 1502)   Rhogam  HBsAg: Negative (02/19 1502)   TDaP vaccine                       Flu Shot: HIV: Non Reactive (02/19 1502)   Baby Food                                GBS:   Contraception  Pap: 10/13/2018, NILM  CBB     CS/VBAC    Support Person               Anatomy scan complete today. Left ventricle of brain at upper limits of normal (9.7 mm), after consultation with MD will recheck in 4 weeks. Consider MFM referral at that time if findings abnormal.  Preterm labor symptoms and general obstetric precautions including but not limited to vaginal bleeding, contractions, leaking of fluid and fetal movement were reviewed.  Please refer to After Visit Summary for other counseling recommendations.   Return in about 2 weeks (around 01/27/2019) for ROB telephone.  Marcelyn Bruins, CNM 01/13/2019  10:03 AM

## 2019-01-13 NOTE — Patient Instructions (Signed)
Third Trimester of Pregnancy The third trimester is from week 28 through week 40 (months 7 through 9). The third trimester is a time when the unborn baby (fetus) is growing rapidly. At the end of the ninth month, the fetus is about 20 inches in length and weighs 6-10 pounds. Body changes during your third trimester Your body will continue to go through many changes during pregnancy. The changes vary from woman to woman. During the third trimester:  Your weight will continue to increase. You can expect to gain 25-35 pounds (11-16 kg) by the end of the pregnancy.  You may begin to get stretch marks on your hips, abdomen, and breasts.  You may urinate more often because the fetus is moving lower into your pelvis and pressing on your bladder.  You may develop or continue to have heartburn. This is caused by increased hormones that slow down muscles in the digestive tract.  You may develop or continue to have constipation because increased hormones slow digestion and cause the muscles that push waste through your intestines to relax.  You may develop hemorrhoids. These are swollen veins (varicose veins) in the rectum that can itch or be painful.  You may develop swollen, bulging veins (varicose veins) in your legs.  You may have increased body aches in the pelvis, back, or thighs. This is due to weight gain and increased hormones that are relaxing your joints.  You may have changes in your hair. These can include thickening of your hair, rapid growth, and changes in texture. Some women also have hair loss during or after pregnancy, or hair that feels dry or thin. Your hair will most likely return to normal after your baby is born.  Your breasts will continue to grow and they will continue to become tender. A yellow fluid (colostrum) may leak from your breasts. This is the first milk you are producing for your baby.  Your belly button may stick out.  You may notice more swelling in your hands,  face, or ankles.  You may have increased tingling or numbness in your hands, arms, and legs. The skin on your belly may also feel numb.  You may feel short of breath because of your expanding uterus.  You may have more problems sleeping. This can be caused by the size of your belly, increased need to urinate, and an increase in your body's metabolism.  You may notice the fetus "dropping," or moving lower in your abdomen (lightening).  You may have increased vaginal discharge.  You may notice your joints feel loose and you may have pain around your pelvic bone. What to expect at prenatal visits You will have prenatal exams every 2 weeks until week 36. Then you will have weekly prenatal exams. During a routine prenatal visit:  You will be weighed to make sure you and the baby are growing normally.  Your blood pressure will be taken.  Your abdomen will be measured to track your baby's growth.  The fetal heartbeat will be listened to.  Any test results from the previous visit will be discussed.  You may have a cervical check near your due date to see if your cervix has softened or thinned (effaced).  You will be tested for Group B streptococcus. This happens between 35 and 37 weeks. Your health care provider may ask you:  What your birth plan is.  How you are feeling.  If you are feeling the baby move.  If you have had any abnormal   symptoms, such as leaking fluid, bleeding, severe headaches, or abdominal cramping.  If you are using any tobacco products, including cigarettes, chewing tobacco, and electronic cigarettes.  If you have any questions. Other tests or screenings that may be performed during your third trimester include:  Blood tests that check for low iron levels (anemia).  Fetal testing to check the health, activity level, and growth of the fetus. Testing is done if you have certain medical conditions or if there are problems during the pregnancy.  Nonstress test  (NST). This test checks the health of your baby to make sure there are no signs of problems, such as the baby not getting enough oxygen. During this test, a belt is placed around your belly. The baby is made to move, and its heart rate is monitored during movement. What is false labor? False labor is a condition in which you feel small, irregular tightenings of the muscles in the womb (contractions) that usually go away with rest, changing position, or drinking water. These are called Braxton Hicks contractions. Contractions may last for hours, days, or even weeks before true labor sets in. If contractions come at regular intervals, become more frequent, increase in intensity, or become painful, you should see your health care provider. What are the signs of labor?  Abdominal cramps.  Regular contractions that start at 10 minutes apart and become stronger and more frequent with time.  Contractions that start on the top of the uterus and spread down to the lower abdomen and back.  Increased pelvic pressure and dull back pain.  A watery or bloody mucus discharge that comes from the vagina.  Leaking of amniotic fluid. This is also known as your "water breaking." It could be a slow trickle or a gush. Let your health care provider know if it has a color or strange odor. If you have any of these signs, call your health care provider right away, even if it is before your due date. Follow these instructions at home: Medicines  Follow your health care provider's instructions regarding medicine use. Specific medicines may be either safe or unsafe to take during pregnancy.  Take a prenatal vitamin that contains at least 600 micrograms (mcg) of folic acid.  If you develop constipation, try taking a stool softener if your health care provider approves. Eating and drinking   Eat a balanced diet that includes fresh fruits and vegetables, whole grains, good sources of protein such as meat, eggs, or tofu,  and low-fat dairy. Your health care provider will help you determine the amount of weight gain that is right for you.  Avoid raw meat and uncooked cheese. These carry germs that can cause birth defects in the baby.  If you have low calcium intake from food, talk to your health care provider about whether you should take a daily calcium supplement.  Eat four or five small meals rather than three large meals a day.  Limit foods that are high in fat and processed sugars, such as fried and sweet foods.  To prevent constipation: ? Drink enough fluid to keep your urine clear or pale yellow. ? Eat foods that are high in fiber, such as fresh fruits and vegetables, whole grains, and beans. Activity  Exercise only as directed by your health care provider. Most women can continue their usual exercise routine during pregnancy. Try to exercise for 30 minutes at least 5 days a week. Stop exercising if you experience uterine contractions.  Avoid heavy lifting.  Do   not exercise in extreme heat or humidity, or at high altitudes.  Wear low-heel, comfortable shoes.  Practice good posture.  You may continue to have sex unless your health care provider tells you otherwise. Relieving pain and discomfort  Take frequent breaks and rest with your legs elevated if you have leg cramps or low back pain.  Take warm sitz baths to soothe any pain or discomfort caused by hemorrhoids. Use hemorrhoid cream if your health care provider approves.  Wear a good support bra to prevent discomfort from breast tenderness.  If you develop varicose veins: ? Wear support pantyhose or compression stockings as told by your healthcare provider. ? Elevate your feet for 15 minutes, 3-4 times a day. Prenatal care  Write down your questions. Take them to your prenatal visits.  Keep all your prenatal visits as told by your health care provider. This is important. Safety  Wear your seat belt at all times when driving.  Make  a list of emergency phone numbers, including numbers for family, friends, the hospital, and police and fire departments. General instructions  Avoid cat litter boxes and soil used by cats. These carry germs that can cause birth defects in the baby. If you have a cat, ask someone to clean the litter box for you.  Do not travel far distances unless it is absolutely necessary and only with the approval of your health care provider.  Do not use hot tubs, steam rooms, or saunas.  Do not drink alcohol.  Do not use any products that contain nicotine or tobacco, such as cigarettes and e-cigarettes. If you need help quitting, ask your health care provider.  Do not use any medicinal herbs or unprescribed drugs. These chemicals affect the formation and growth of the baby.  Do not douche or use tampons or scented sanitary pads.  Do not cross your legs for long periods of time.  To prepare for the arrival of your baby: ? Take prenatal classes to understand, practice, and ask questions about labor and delivery. ? Make a trial run to the hospital. ? Visit the hospital and tour the maternity area. ? Arrange for maternity or paternity leave through employers. ? Arrange for family and friends to take care of pets while you are in the hospital. ? Purchase a rear-facing car seat and make sure you know how to install it in your car. ? Pack your hospital bag. ? Prepare the baby's nursery. Make sure to remove all pillows and stuffed animals from the baby's crib to prevent suffocation.  Visit your dentist if you have not gone during your pregnancy. Use a soft toothbrush to brush your teeth and be gentle when you floss. Contact a health care provider if:  You are unsure if you are in labor or if your water has broken.  You become dizzy.  You have mild pelvic cramps, pelvic pressure, or nagging pain in your abdominal area.  You have lower back pain.  You have persistent nausea, vomiting, or  diarrhea.  You have an unusual or bad smelling vaginal discharge.  You have pain when you urinate. Get help right away if:  Your water breaks before 37 weeks.  You have regular contractions less than 5 minutes apart before 37 weeks.  You have a fever.  You are leaking fluid from your vagina.  You have spotting or bleeding from your vagina.  You have severe abdominal pain or cramping.  You have rapid weight loss or weight gain.  You have   shortness of breath with chest pain.  You notice sudden or extreme swelling of your face, hands, ankles, feet, or legs.  Your baby makes fewer than 10 movements in 2 hours.  You have severe headaches that do not go away when you take medicine.  You have vision changes. Summary  The third trimester is from week 28 through week 40, months 7 through 9. The third trimester is a time when the unborn baby (fetus) is growing rapidly.  During the third trimester, your discomfort may increase as you and your baby continue to gain weight. You may have abdominal, leg, and back pain, sleeping problems, and an increased need to urinate.  During the third trimester your breasts will keep growing and they will continue to become tender. A yellow fluid (colostrum) may leak from your breasts. This is the first milk you are producing for your baby.  False labor is a condition in which you feel small, irregular tightenings of the muscles in the womb (contractions) that eventually go away. These are called Braxton Hicks contractions. Contractions may last for hours, days, or even weeks before true labor sets in.  Signs of labor can include: abdominal cramps; regular contractions that start at 10 minutes apart and become stronger and more frequent with time; watery or bloody mucus discharge that comes from the vagina; increased pelvic pressure and dull back pain; and leaking of amniotic fluid. This information is not intended to replace advice given to you by your  health care provider. Make sure you discuss any questions you have with your health care provider. Document Released: 08/05/2001 Document Revised: 09/16/2016 Document Reviewed: 09/16/2016 Elsevier Interactive Patient Education  2019 [employer]  

## 2019-01-14 ENCOUNTER — Other Ambulatory Visit: Payer: Self-pay | Admitting: Advanced Practice Midwife

## 2019-01-14 DIAGNOSIS — D509 Iron deficiency anemia, unspecified: Secondary | ICD-10-CM

## 2019-01-14 DIAGNOSIS — O99012 Anemia complicating pregnancy, second trimester: Secondary | ICD-10-CM

## 2019-01-14 LAB — 28 WEEK RH+PANEL
Basophils Absolute: 0 10*3/uL (ref 0.0–0.2)
Basos: 0 %
EOS (ABSOLUTE): 0 10*3/uL (ref 0.0–0.4)
Eos: 0 %
Gestational Diabetes Screen: 74 mg/dL (ref 65–139)
HIV Screen 4th Generation wRfx: NONREACTIVE
Hematocrit: 28 % — ABNORMAL LOW (ref 34.0–46.6)
Hemoglobin: 8.7 g/dL — ABNORMAL LOW (ref 11.1–15.9)
Immature Grans (Abs): 0 10*3/uL (ref 0.0–0.1)
Immature Granulocytes: 1 %
Lymphocytes Absolute: 2.2 10*3/uL (ref 0.7–3.1)
Lymphs: 26 %
MCH: 23 pg — ABNORMAL LOW (ref 26.6–33.0)
MCHC: 31.1 g/dL — ABNORMAL LOW (ref 31.5–35.7)
MCV: 74 fL — ABNORMAL LOW (ref 79–97)
Monocytes Absolute: 0.7 10*3/uL (ref 0.1–0.9)
Monocytes: 8 %
Neutrophils Absolute: 5.6 10*3/uL (ref 1.4–7.0)
Neutrophils: 65 %
Platelets: 297 10*3/uL (ref 150–450)
RBC: 3.78 x10E6/uL (ref 3.77–5.28)
RDW: 15.6 % — ABNORMAL HIGH (ref 11.7–15.4)
RPR Ser Ql: NONREACTIVE
WBC: 8.6 10*3/uL (ref 3.4–10.8)

## 2019-01-14 NOTE — Progress Notes (Signed)
Referral to hematology for Fe infusion consult. Left message for patient.

## 2019-01-19 ENCOUNTER — Telehealth: Payer: Self-pay

## 2019-01-19 NOTE — Telephone Encounter (Signed)
April Clay from Hematology [facility] at [facility] called to let us know she tried calling pt to set up appt.  Pt stated she didn't know about referral; didn't know who the doctor was; no one talked to her about this.  Efraim Kaufmann is working from home.  Her # is [phone]

## 2019-01-19 NOTE — Telephone Encounter (Signed)
Spoke with patient who said she had not listened to her voicemail. She now knows about the referral to Hematology. Called Melissa from Hematology who will follow up with scheduling patient for referral visit.

## 2019-01-20 NOTE — Progress Notes (Signed)
[facility]  [address], [address] [place], [place] [address] Phone: [phone]  Fax: [phone]   Clinic Day:  01/21/2019  Referring physician: Tresea Mall, CNM  Chief Complaint: April Clay is a 23 y.o. female with iron deficiency anemia during pregnancy who is referred in consultation with Tresea Mall, CNM for assessment and management.   HPI:  The patient has a history of microcytic anemia dating back to at least 06/15/2014. She has known about her iron deficiency anemia since her 2nd pregnancy in 2018. She is unsure if she was on oral iron during her pregnancy.  She notes constipation with oral iron.    She received IV iron (Feraheme x 2) without complication on 05/28/2017 and 06/04/2017.  She described being lightheaded prior to treatment, which improved with the infusion. She was previously seen by Dr. Damita Dunnings, hematologist, at [facility] in [place].  She is unsure if her anemia improved after the pregnancy. She denies any heavy periods between the pregnancies.   MCV has ranged between 62-75.  Hemoglobin electrophoresis was normal on 06/15/2014.  Ferritin has been followed: 115 on 09/11/2014, 5 on 03/17/2017, 5 on 04/07/2017, and 252 on 06/02/2017.  The patient was diagnosed with iron deficiency anemia on blood work at her OBGYN, Dr. Velora Mediate. She was initially noticed to have low hemoglobin on 10/20/2018 and was advised by her midwife to take prenatal vitamins containing iron. She previously tried oral iron therapy and was significantly constipated.   She was referred by her nurse midwife, Tresea Mall, CNM on 01/13/2019 for consideration of IV iron infusion. She is approximately [redacted]w[redacted]d pregnant.   CBCs have been followed: 06/22/2018: Hematocrit 36.4, hemoglobin 11.2, MCV 72.4, WBC 17,700, and platelets 278,000.  10/13/2018: Hematocrit 34.3, hemoglobin 10.8, MCV 73, WBC 6900, and platelets 396,000.  Hemoglobin electrophoresis was  normal. 01/13/2019: Hematocrit 28.0, hemoglobin 8.7, MCV 74, WBC 8600, and platelets 279,000.   Symptomatically, she is "fine."  She is gaining weight appropriately. She had headaches during her 2nd trimester due to cutting out coffee. She denies any chest or breathing issues, urinary symptoms, or bone and joint issues.  She has been taking prenatal vitamins since she found out she was pregnant.   This is her 3rd pregnancy. She found out about her pregnancy around Thanksgiving. She had one episode of lightheadedness last night. She denies an nausea, vomiting, blood in her stools, or black stools.   She does not eat pork, mainly chicken and fish. She eats vegetables daily. She denies any ice cravings. She denies any restless legs.   She thinks her mother was anemic and notes her mother had heavy periods.    Past Medical History:  Diagnosis Date   ADHD    Adjustment disorder with mixed disturbance of emotions and conduct    impulsively took pills in context of diagreement with significant other   Allergic rhinitis    Amblyopia    Anemia    Developmental delay    related to school   Mild intermittent asthma     History reviewed. No pertinent surgical history.  Family History  Problem Relation Age of Onset   Asthma Mother    Sarcoidosis Mother    Diabetes Maternal Grandmother    Hypertension Maternal Grandmother    Asthma Maternal Grandfather    Diabetes Paternal Grandmother    Cancer Paternal Grandfather        prostate    Social History:  reports that she has never smoked. She  has never used smokeless tobacco. She reports previous alcohol use. She reports that she does not use drugs. She does not smoke or drink. She does not currently work. She has 2 boys, who are healthy, and is expecting her third.  She lives in [place].  The patient is alone today.  Allergies: No Known Allergies  Current Medications: Current Outpatient Medications  Medication Sig  Dispense Refill   albuterol (PROVENTIL HFA;VENTOLIN HFA) 108 (90 Base) MCG/ACT inhaler Inhale 1-2 puffs into the lungs every 6 (six) hours as needed for wheezing or shortness of breath. 1 Inhaler 3   Butalbital-APAP-Caffeine 50-325-40 MG capsule Take 1 capsule by mouth every 6 (six) hours as needed for headache. 20 capsule 0   Prenatal Vit-Fe Phos-FA-Omega (VITAFOL GUMMIES) 3.33-0.333-34.8 MG CHEW Chew 1 tablet by mouth 2 (two) times daily. 60 tablet 8   No current facility-administered medications for this visit.     Review of Systems  Constitutional: Negative for chills, diaphoresis, fever, malaise/fatigue and weight loss (weight gain appropriate with prenancy).  HENT: Negative for congestion, hearing loss, sinus pain and sore throat.   Eyes: Negative for blurred vision.  Respiratory: Negative for cough, sputum production and shortness of breath.   Cardiovascular: Negative for chest pain, palpitations, orthopnea, leg swelling and PND.  Gastrointestinal: Negative for abdominal pain, blood in stool, constipation, diarrhea, heartburn, melena, nausea and vomiting.  Genitourinary: Negative for dysuria, frequency and urgency.  Musculoskeletal: Negative for back pain, joint pain and myalgias.  Skin: Negative for rash.  Neurological: Positive for dizziness (lightheaded, 1 episode) and headaches (resolved). Negative for tingling, sensory change and weakness.  Endo/Heme/Allergies: Does not bruise/bleed easily.  Psychiatric/Behavioral: Negative for depression and memory loss. The patient is not nervous/anxious and does not have insomnia.   All other systems reviewed and are negative.  Performance status (ECOG): 1  Physical Exam  Constitutional: She is oriented to person, place, and time. She appears well-developed and well-nourished. No distress.  HENT:  Head: Normocephalic and atraumatic.  Mouth/Throat: Oropharynx is clear and moist. No oropharyngeal exudate.  Eyes: Pupils are equal, round,  and reactive to light. Conjunctivae and EOM are normal. No scleral icterus.  Neck: Normal range of motion. Neck supple. No JVD present.  Cardiovascular: Normal rate, regular rhythm and normal heart sounds. Exam reveals no gallop.  No murmur heard. Pulmonary/Chest: Effort normal and breath sounds normal. No respiratory distress. She has no wheezes.  Abdominal: Soft. Bowel sounds are normal. She exhibits no distension. There is no abdominal tenderness. There is no rebound and no guarding.  Pregnant.  Musculoskeletal: Normal range of motion.        General: No tenderness or edema.  Lymphadenopathy:    She has no cervical adenopathy.    She has no axillary adenopathy.       Right: No supraclavicular adenopathy present.       Left: No supraclavicular adenopathy present.  Neurological: She is alert and oriented to person, place, and time.  Skin: Skin is warm and dry. No rash noted. She is not diaphoretic. No erythema. No pallor.  Psychiatric: She has a normal mood and affect. Her behavior is normal. Judgment and thought content normal.  Nursing note and vitals reviewed.   No visits with results within 3 Day(s) from this visit.  Latest known visit with results is:  Routine Prenatal on 01/13/2019  Component Date Value Ref Range Status   Glucose, UA 01/13/2019 Negative  Negative Final   POC,PROTEIN,UA 01/13/2019 Negative  Negative, Trace, Small (  1+), Moderate (2+), Large (3+), 4+ Final    Assessment:  April Clay is a 23 y.o. female currently [redacted] weeks pregnant with iron deficiency anemia.  She has a history of microcytic anemia dating back to at least 06/15/2014.  MCV has ranged between 62-75.  Hemoglobin electrophoresis was normal on 06/15/2014.  She received Feraheme (05/28/2017 and 06/04/2017) during her second pregnancy.  She is intolerant of oral iron (constipation).  Ferritin has been followed: 115 on 09/11/2014, 5 on 03/17/2017, 5 on 04/07/2017, and 252 on  06/02/2017.  Symptomatically, she feels "fine".  She denies any melena, hematochezia or hematuria.  She is taking prenatal vitamins.  Exam is unremarkable.  Hemoglobin was 8.7 and MCV 74 on 01/13/2019.  Plan: 1.   Labs today:  CBC with diff, ferritin, iron studies. 2.   Iron deficiency anemia  Discuss management of iron deficiency anemia with prior pregnancies.  She does not wish to try oral ion supplements secondary to constipation.  She wishes to proceed with IV iron.  She previously tolerated Feraheme well.  Patient consented to treatment.  Preauth Feraheme. 3.   RTC in 1 week for Feraheme. 4.   RTC in 2 weeks for Feraheme. 5.   RTC in 4 weeks for labs (CBC with diff, ferritin).   I discussed the assessment and treatment plan with the patient.  The patient was provided an opportunity to ask questions and all were answered.  The patient agreed with the plan and demonstrated an understanding of the instructions.  The patient was advised to call back if the symptoms worsen or if the condition fails to improve as anticipated.  I provided 20 minutes of face-to-face time during this this encounter and > 50% was spent counseling as documented under my assessment and plan.    Arleth Mccullar C. Merlene Pulling, MD, PhD    01/21/2019, 1:44 PM  I, Molly Dorshimer, am acting as Neurosurgeon for [employer]. Merlene Pulling, MD, PhD.  I, Kuba Shepherd C. Merlene Pulling, MD, have reviewed the above documentation for accuracy and completeness, and I agree with the above.

## 2019-01-21 ENCOUNTER — Other Ambulatory Visit: Payer: Self-pay

## 2019-01-21 ENCOUNTER — Encounter: Payer: Self-pay | Admitting: Hematology and Oncology

## 2019-01-21 ENCOUNTER — Inpatient Hospital Stay: Payer: Medicaid Other

## 2019-01-21 ENCOUNTER — Inpatient Hospital Stay: Payer: Medicaid Other | Attending: Hematology and Oncology | Admitting: Hematology and Oncology

## 2019-01-21 DIAGNOSIS — D509 Iron deficiency anemia, unspecified: Secondary | ICD-10-CM

## 2019-01-21 DIAGNOSIS — Z8042 Family history of malignant neoplasm of prostate: Secondary | ICD-10-CM

## 2019-01-21 DIAGNOSIS — O99013 Anemia complicating pregnancy, third trimester: Secondary | ICD-10-CM

## 2019-01-21 DIAGNOSIS — Z3A28 28 weeks gestation of pregnancy: Secondary | ICD-10-CM

## 2019-01-21 LAB — CBC WITH DIFFERENTIAL/PLATELET
Abs Immature Granulocytes: 0.05 10*3/uL (ref 0.00–0.07)
Basophils Absolute: 0 10*3/uL (ref 0.0–0.1)
Basophils Relative: 0 %
Eosinophils Absolute: 0 10*3/uL (ref 0.0–0.5)
Eosinophils Relative: 0 %
HCT: 26.9 % — ABNORMAL LOW (ref 36.0–46.0)
Hemoglobin: 8.6 g/dL — ABNORMAL LOW (ref 12.0–15.0)
Immature Granulocytes: 1 %
Lymphocytes Relative: 22 %
Lymphs Abs: 1.7 10*3/uL (ref 0.7–4.0)
MCH: 23.6 pg — ABNORMAL LOW (ref 26.0–34.0)
MCHC: 32 g/dL (ref 30.0–36.0)
MCV: 73.9 fL — ABNORMAL LOW (ref 80.0–100.0)
Monocytes Absolute: 0.6 10*3/uL (ref 0.1–1.0)
Monocytes Relative: 8 %
Neutro Abs: 5.4 10*3/uL (ref 1.7–7.7)
Neutrophils Relative %: 69 %
Platelets: 224 10*3/uL (ref 150–400)
RBC: 3.64 MIL/uL — ABNORMAL LOW (ref 3.87–5.11)
RDW: 15.9 % — ABNORMAL HIGH (ref 11.5–15.5)
WBC: 7.8 10*3/uL (ref 4.0–10.5)
nRBC: 0 % (ref 0.0–0.2)

## 2019-01-21 LAB — IRON AND TIBC
Iron: 31 ug/dL (ref 28–170)
Saturation Ratios: 7 % — ABNORMAL LOW (ref 10.4–31.8)
TIBC: 475 ug/dL — ABNORMAL HIGH (ref 250–450)
UIBC: 444 ug/dL

## 2019-01-21 LAB — FERRITIN: Ferritin: 6 ng/mL — ABNORMAL LOW (ref 11–307)

## 2019-01-21 NOTE — Progress Notes (Signed)
Pt here as new patient for Anemia. Patient will e [redacted] weeks pregnant on 01/23/2019. Denies any concerns.

## 2019-01-21 NOTE — Patient Instructions (Signed)

## 2019-01-25 ENCOUNTER — Inpatient Hospital Stay: Payer: Medicaid Other

## 2019-01-26 ENCOUNTER — Other Ambulatory Visit: Payer: Self-pay

## 2019-01-26 ENCOUNTER — Inpatient Hospital Stay: Payer: Medicaid Other | Attending: Hematology and Oncology

## 2019-01-26 VITALS — BP 104/63 | HR 82 | Temp 97.2°F | Resp 18

## 2019-01-26 DIAGNOSIS — D509 Iron deficiency anemia, unspecified: Secondary | ICD-10-CM | POA: Diagnosis present

## 2019-01-26 DIAGNOSIS — O99013 Anemia complicating pregnancy, third trimester: Secondary | ICD-10-CM | POA: Insufficient documentation

## 2019-01-26 MED ORDER — SODIUM CHLORIDE 0.9 % IV SOLN
Freq: Once | INTRAVENOUS | Status: AC
  Administered 2019-01-26: 11:00:00 via INTRAVENOUS
  Filled 2019-01-26: qty 250

## 2019-01-26 MED ORDER — FERUMOXYTOL INJECTION 510 MG/17 ML
INTRAVENOUS | Status: AC
  Filled 2019-01-26: qty 17

## 2019-01-26 MED ORDER — SODIUM CHLORIDE 0.9 % IV SOLN
510.0000 mg | Freq: Once | INTRAVENOUS | Status: AC
  Administered 2019-01-26: 510 mg via INTRAVENOUS
  Filled 2019-01-26: qty 17

## 2019-01-26 NOTE — Patient Instructions (Signed)

## 2019-01-27 ENCOUNTER — Ambulatory Visit (INDEPENDENT_AMBULATORY_CARE_PROVIDER_SITE_OTHER): Payer: Medicaid Other | Admitting: Maternal Newborn

## 2019-01-27 ENCOUNTER — Encounter: Payer: Self-pay | Admitting: Maternal Newborn

## 2019-01-27 DIAGNOSIS — Z3A29 29 weeks gestation of pregnancy: Secondary | ICD-10-CM

## 2019-01-27 DIAGNOSIS — O99013 Anemia complicating pregnancy, third trimester: Secondary | ICD-10-CM | POA: Diagnosis not present

## 2019-01-27 DIAGNOSIS — O0933 Supervision of pregnancy with insufficient antenatal care, third trimester: Secondary | ICD-10-CM | POA: Diagnosis not present

## 2019-01-27 DIAGNOSIS — Z3480 Encounter for supervision of other normal pregnancy, unspecified trimester: Secondary | ICD-10-CM

## 2019-01-27 DIAGNOSIS — Z369 Encounter for antenatal screening, unspecified: Secondary | ICD-10-CM

## 2019-01-27 NOTE — Progress Notes (Signed)
Virtual Visit via Telephone Note  I connected with April Clay on 01/27/19 at 2:10 PM EDT by telephone and verified that I am speaking with the correct person using two identifiers.  Location: Patient: Home Provider: Office   I discussed the limitations performing an evaluation and management service by telephone and the availability of in person appointments. The patient agreed to proceed.  Documentation of the prenatal telephone visit in the note below:   Subjective  April Clay is a 23 y.o. G3P2002 at 6649w4d being seen today for ongoing prenatal care.  She is currently monitored for the following issues for this low-risk pregnancy and has Allergic rhinitis; Mild intermittent asthma; Anemia; ADHD; Supervision of other normal pregnancy, antepartum; Limited prenatal care, antepartum; and Iron deficiency anemia on their problem list.  ----------------------------------------------------------------------------------- Patient reports that she had her first IV iron infusion yesterday. She is feeling fine. Baby is moving well. Has occasional heartburn relieved by Tums. Not having headaches as often, had 2 in the past week and they were relieved with medication. Contractions: Not present. Vag. Bleeding: None.  Movement: Present. No leaking of fluid.  ----------------------------------------------------------------------------------- The following portions of the patient's history were reviewed and updated as appropriate: allergies, current medications, past family history, past medical history, past social history, past surgical history and problem list. Problem list updated.   Objective  Last menstrual period 06/24/2018. Pregravid weight 155 lb (70.3 kg) Total Weight Gain 19 lb (8.618 kg) Fetal Status:     Movement: Present     Unable to complete a physical exam due to telephone prenatal visit.  Assessment   23 y.o. [id number] at 6049w4d EDD 04/10/2019, by Ultrasound presenting for a prenatal  telephone visit.  Plan   pregnancy3 Problems (from 06/24/18 to present)    Problem Noted Resolved   Supervision of other normal pregnancy, antepartum 10/20/2018 by Oswaldo Conroy,  Y, CNM No   Overview Addendum 01/13/2019 10:06 AM by Oswaldo Conroy,  Y, CNM    Clinic [facility] Prenatal Labs  Dating Ultrasound 15 w Blood type: A/Positive/-- (02/19 1502)   Genetic Screen Declined Antibody:Negative (02/19 1502)  Anatomic US Complete, follow up left ventricle measurement Rubella: 14.20 (02/19 1502) Varicella: Immune  GTT Early:               Third trimester:  RPR: Non Reactive (02/19 1502)   Rhogam N/A HBsAg: Negative (02/19 1502)   TDaP vaccine                       Flu Shot: HIV: Non Reactive (02/19 1502)   Baby Food                                GBS:   Contraception  Pap: 10/13/2018, NILM  CBB     CS/VBAC    Support Person                 I discussed the assessment and treatment plan with the patient. The patient was provided an opportunity to ask questions and all were answered. The patient agreed with the plan and demonstrated an understanding of the instructions. Please refer to After Visit Summary for other counseling recommendations.   The patient was advised to call back or seek an in-person evaluation if needed.  I provided 5 minutes of non-face-to-face time during this encounter.  Return in about 2 weeks (around 02/10/2019) for ROB and ultrasound.  Marcelyn Bruins ,  CNM 01/27/2019  2:19 PM

## 2019-01-27 NOTE — Patient Instructions (Signed)
Third Trimester of Pregnancy The third trimester is from week 28 through week 40 (months 7 through 9). The third trimester is a time when the unborn baby (fetus) is growing rapidly. At the end of the ninth month, the fetus is about 20 inches in length and weighs 6-10 pounds. Body changes during your third trimester Your body will continue to go through many changes during pregnancy. The changes vary from woman to woman. During the third trimester:  Your weight will continue to increase. You can expect to gain 25-35 pounds (11-16 kg) by the end of the pregnancy.  You may begin to get stretch marks on your hips, abdomen, and breasts.  You may urinate more often because the fetus is moving lower into your pelvis and pressing on your bladder.  You may develop or continue to have heartburn. This is caused by increased hormones that slow down muscles in the digestive tract.  You may develop or continue to have constipation because increased hormones slow digestion and cause the muscles that push waste through your intestines to relax.  You may develop hemorrhoids. These are swollen veins (varicose veins) in the rectum that can itch or be painful.  You may develop swollen, bulging veins (varicose veins) in your legs.  You may have increased body aches in the pelvis, back, or thighs. This is due to weight gain and increased hormones that are relaxing your joints.  You may have changes in your hair. These can include thickening of your hair, rapid growth, and changes in texture. Some women also have hair loss during or after pregnancy, or hair that feels dry or thin. Your hair will most likely return to normal after your baby is born.  Your breasts will continue to grow and they will continue to become tender. A yellow fluid (colostrum) may leak from your breasts. This is the first milk you are producing for your baby.  Your belly button may stick out.  You may notice more swelling in your hands,  face, or ankles.  You may have increased tingling or numbness in your hands, arms, and legs. The skin on your belly may also feel numb.  You may feel short of breath because of your expanding uterus.  You may have more problems sleeping. This can be caused by the size of your belly, increased need to urinate, and an increase in your body's metabolism.  You may notice the fetus "dropping," or moving lower in your abdomen (lightening).  You may have increased vaginal discharge.  You may notice your joints feel loose and you may have pain around your pelvic bone. What to expect at prenatal visits You will have prenatal exams every 2 weeks until week 36. Then you will have weekly prenatal exams. During a routine prenatal visit:  You will be weighed to make sure you and the baby are growing normally.  Your blood pressure will be taken.  Your abdomen will be measured to track your baby's growth.  The fetal heartbeat will be listened to.  Any test results from the previous visit will be discussed.  You may have a cervical check near your due date to see if your cervix has softened or thinned (effaced).  You will be tested for Group B streptococcus. This happens between 35 and 37 weeks. Your health care provider may ask you:  What your birth plan is.  How you are feeling.  If you are feeling the baby move.  If you have had any abnormal   symptoms, such as leaking fluid, bleeding, severe headaches, or abdominal cramping.  If you are using any tobacco products, including cigarettes, chewing tobacco, and electronic cigarettes.  If you have any questions. Other tests or screenings that may be performed during your third trimester include:  Blood tests that check for low iron levels (anemia).  Fetal testing to check the health, activity level, and growth of the fetus. Testing is done if you have certain medical conditions or if there are problems during the pregnancy.  Nonstress test  (NST). This test checks the health of your baby to make sure there are no signs of problems, such as the baby not getting enough oxygen. During this test, a belt is placed around your belly. The baby is made to move, and its heart rate is monitored during movement. What is false labor? False labor is a condition in which you feel small, irregular tightenings of the muscles in the womb (contractions) that usually go away with rest, changing position, or drinking water. These are called Braxton Hicks contractions. Contractions may last for hours, days, or even weeks before true labor sets in. If contractions come at regular intervals, become more frequent, increase in intensity, or become painful, you should see your health care provider. What are the signs of labor?  Abdominal cramps.  Regular contractions that start at 10 minutes apart and become stronger and more frequent with time.  Contractions that start on the top of the uterus and spread down to the lower abdomen and back.  Increased pelvic pressure and dull back pain.  A watery or bloody mucus discharge that comes from the vagina.  Leaking of amniotic fluid. This is also known as your "water breaking." It could be a slow trickle or a gush. Let your health care provider know if it has a color or strange odor. If you have any of these signs, call your health care provider right away, even if it is before your due date. Follow these instructions at home: Medicines  Follow your health care provider's instructions regarding medicine use. Specific medicines may be either safe or unsafe to take during pregnancy.  Take a prenatal vitamin that contains at least 600 micrograms (mcg) of folic acid.  If you develop constipation, try taking a stool softener if your health care provider approves. Eating and drinking   Eat a balanced diet that includes fresh fruits and vegetables, whole grains, good sources of protein such as meat, eggs, or tofu,  and low-fat dairy. Your health care provider will help you determine the amount of weight gain that is right for you.  Avoid raw meat and uncooked cheese. These carry germs that can cause birth defects in the baby.  If you have low calcium intake from food, talk to your health care provider about whether you should take a daily calcium supplement.  Eat four or five small meals rather than three large meals a day.  Limit foods that are high in fat and processed sugars, such as fried and sweet foods.  To prevent constipation: ? Drink enough fluid to keep your urine clear or pale yellow. ? Eat foods that are high in fiber, such as fresh fruits and vegetables, whole grains, and beans. Activity  Exercise only as directed by your health care provider. Most women can continue their usual exercise routine during pregnancy. Try to exercise for 30 minutes at least 5 days a week. Stop exercising if you experience uterine contractions.  Avoid heavy lifting.  Do   not exercise in extreme heat or humidity, or at high altitudes.  Wear low-heel, comfortable shoes.  Practice good posture.  You may continue to have sex unless your health care provider tells you otherwise. Relieving pain and discomfort  Take frequent breaks and rest with your legs elevated if you have leg cramps or low back pain.  Take warm sitz baths to soothe any pain or discomfort caused by hemorrhoids. Use hemorrhoid cream if your health care provider approves.  Wear a good support bra to prevent discomfort from breast tenderness.  If you develop varicose veins: ? Wear support pantyhose or compression stockings as told by your healthcare provider. ? Elevate your feet for 15 minutes, 3-4 times a day. Prenatal care  Write down your questions. Take them to your prenatal visits.  Keep all your prenatal visits as told by your health care provider. This is important. Safety  Wear your seat belt at all times when driving.  Make  a list of emergency phone numbers, including numbers for family, friends, the hospital, and police and fire departments. General instructions  Avoid cat litter boxes and soil used by cats. These carry germs that can cause birth defects in the baby. If you have a cat, ask someone to clean the litter box for you.  Do not travel far distances unless it is absolutely necessary and only with the approval of your health care provider.  Do not use hot tubs, steam rooms, or saunas.  Do not drink alcohol.  Do not use any products that contain nicotine or tobacco, such as cigarettes and e-cigarettes. If you need help quitting, ask your health care provider.  Do not use any medicinal herbs or unprescribed drugs. These chemicals affect the formation and growth of the baby.  Do not douche or use tampons or scented sanitary pads.  Do not cross your legs for long periods of time.  To prepare for the arrival of your baby: ? Take prenatal classes to understand, practice, and ask questions about labor and delivery. ? Make a trial run to the hospital. ? Visit the hospital and tour the maternity area. ? Arrange for maternity or paternity leave through employers. ? Arrange for family and friends to take care of pets while you are in the hospital. ? Purchase a rear-facing car seat and make sure you know how to install it in your car. ? Pack your hospital bag. ? Prepare the baby's nursery. Make sure to remove all pillows and stuffed animals from the baby's crib to prevent suffocation.  Visit your dentist if you have not gone during your pregnancy. Use a soft toothbrush to brush your teeth and be gentle when you floss. Contact a health care provider if:  You are unsure if you are in labor or if your water has broken.  You become dizzy.  You have mild pelvic cramps, pelvic pressure, or nagging pain in your abdominal area.  You have lower back pain.  You have persistent nausea, vomiting, or  diarrhea.  You have an unusual or bad smelling vaginal discharge.  You have pain when you urinate. Get help right away if:  Your water breaks before 37 weeks.  You have regular contractions less than 5 minutes apart before 37 weeks.  You have a fever.  You are leaking fluid from your vagina.  You have spotting or bleeding from your vagina.  You have severe abdominal pain or cramping.  You have rapid weight loss or weight gain.  You have   shortness of breath with chest pain.  You notice sudden or extreme swelling of your face, hands, ankles, feet, or legs.  Your baby makes fewer than 10 movements in 2 hours.  You have severe headaches that do not go away when you take medicine.  You have vision changes. Summary  The third trimester is from week 28 through week 40, months 7 through 9. The third trimester is a time when the unborn baby (fetus) is growing rapidly.  During the third trimester, your discomfort may increase as you and your baby continue to gain weight. You may have abdominal, leg, and back pain, sleeping problems, and an increased need to urinate.  During the third trimester your breasts will keep growing and they will continue to become tender. A yellow fluid (colostrum) may leak from your breasts. This is the first milk you are producing for your baby.  False labor is a condition in which you feel small, irregular tightenings of the muscles in the womb (contractions) that eventually go away. These are called Braxton Hicks contractions. Contractions may last for hours, days, or even weeks before true labor sets in.  Signs of labor can include: abdominal cramps; regular contractions that start at 10 minutes apart and become stronger and more frequent with time; watery or bloody mucus discharge that comes from the vagina; increased pelvic pressure and dull back pain; and leaking of amniotic fluid. This information is not intended to replace advice given to you by your  health care provider. Make sure you discuss any questions you have with your health care provider. Document Released: 08/05/2001 Document Revised: 09/16/2016 Document Reviewed: 09/16/2016 Elsevier Interactive Patient Education  2019 [employer]  

## 2019-01-27 NOTE — Progress Notes (Signed)
Phone visit today

## 2019-02-01 ENCOUNTER — Other Ambulatory Visit: Payer: Self-pay

## 2019-02-01 ENCOUNTER — Inpatient Hospital Stay: Payer: Medicaid Other

## 2019-02-01 VITALS — BP 122/67 | HR 87 | Temp 95.7°F | Resp 18

## 2019-02-01 DIAGNOSIS — O99013 Anemia complicating pregnancy, third trimester: Secondary | ICD-10-CM | POA: Diagnosis not present

## 2019-02-01 DIAGNOSIS — D509 Iron deficiency anemia, unspecified: Secondary | ICD-10-CM

## 2019-02-01 MED ORDER — SODIUM CHLORIDE 0.9 % IV SOLN
510.0000 mg | Freq: Once | INTRAVENOUS | Status: AC
  Administered 2019-02-01: 510 mg via INTRAVENOUS
  Filled 2019-02-01: qty 17

## 2019-02-01 MED ORDER — SODIUM CHLORIDE 0.9 % IV SOLN
Freq: Once | INTRAVENOUS | Status: AC
  Administered 2019-02-01: 14:00:00 via INTRAVENOUS
  Filled 2019-02-01: qty 250

## 2019-02-01 NOTE — Patient Instructions (Signed)

## 2019-02-10 ENCOUNTER — Ambulatory Visit: Payer: Medicaid Other

## 2019-02-10 ENCOUNTER — Encounter: Payer: Medicaid Other | Admitting: Obstetrics & Gynecology

## 2019-02-11 ENCOUNTER — Other Ambulatory Visit: Payer: Self-pay

## 2019-02-14 ENCOUNTER — Inpatient Hospital Stay (HOSPITAL_BASED_OUTPATIENT_CLINIC_OR_DEPARTMENT_OTHER): Payer: Medicaid Other | Admitting: Hematology and Oncology

## 2019-02-14 ENCOUNTER — Inpatient Hospital Stay: Payer: Medicaid Other

## 2019-02-14 ENCOUNTER — Encounter: Payer: Self-pay | Admitting: Hematology and Oncology

## 2019-02-14 ENCOUNTER — Other Ambulatory Visit: Payer: Self-pay

## 2019-02-14 VITALS — BP 120/73 | HR 90 | Temp 97.1°F | Resp 18 | Ht 67.0 in | Wt 178.2 lb

## 2019-02-14 DIAGNOSIS — O99013 Anemia complicating pregnancy, third trimester: Secondary | ICD-10-CM

## 2019-02-14 DIAGNOSIS — D509 Iron deficiency anemia, unspecified: Secondary | ICD-10-CM

## 2019-02-14 DIAGNOSIS — Z3A32 32 weeks gestation of pregnancy: Secondary | ICD-10-CM | POA: Diagnosis not present

## 2019-02-14 LAB — CBC WITH DIFFERENTIAL/PLATELET
Abs Immature Granulocytes: 0.03 10*3/uL (ref 0.00–0.07)
Basophils Absolute: 0 10*3/uL (ref 0.0–0.1)
Basophils Relative: 0 %
Eosinophils Absolute: 0 10*3/uL (ref 0.0–0.5)
Eosinophils Relative: 1 %
HCT: 28.5 % — ABNORMAL LOW (ref 36.0–46.0)
Hemoglobin: 8.9 g/dL — ABNORMAL LOW (ref 12.0–15.0)
Immature Granulocytes: 1 %
Lymphocytes Relative: 28 %
Lymphs Abs: 1.9 10*3/uL (ref 0.7–4.0)
MCH: 23.7 pg — ABNORMAL LOW (ref 26.0–34.0)
MCHC: 31.2 g/dL (ref 30.0–36.0)
MCV: 76 fL — ABNORMAL LOW (ref 80.0–100.0)
Monocytes Absolute: 0.6 10*3/uL (ref 0.1–1.0)
Monocytes Relative: 9 %
Neutro Abs: 4.1 10*3/uL (ref 1.7–7.7)
Neutrophils Relative %: 61 %
Platelets: 181 10*3/uL (ref 150–400)
RBC: 3.75 MIL/uL — ABNORMAL LOW (ref 3.87–5.11)
RDW: 19.2 % — ABNORMAL HIGH (ref 11.5–15.5)
WBC: 6.6 10*3/uL (ref 4.0–10.5)
nRBC: 0 % (ref 0.0–0.2)

## 2019-02-14 LAB — FERRITIN: Ferritin: 297 ng/mL (ref 11–307)

## 2019-02-14 NOTE — Progress Notes (Signed)
No new changes noted today 

## 2019-02-14 NOTE — Progress Notes (Signed)
[facility]  [address] [place], [place] [address] Phone: [phone]  Fax: [phone]   Clinic Day:  02/14/2019  Referring physician: Lequita Asal, MD  Chief Complaint: April Clay is a 23 y.o. female with iron deficiency anemia during pregnancy who is seen for 3 week assessment.   HPI: The patient was last seen in the hematology clinic on 01/21/2019. At that time, she felt "fine".  She denied any melena, hematochezia or hematuria.  She was taking prenatal vitamins.  Exam was unremarkable.  Hemoglobin was 8.7 and MCV 74 on 01/13/2019.  She was seen on 01/27/2019 by Avel Sensor, CNM. She had no complications.   She received Feraheme on 01/26/2019 and 02/01/2019.   During the interim, the patient is doing good. She is [redacted] weeks pregnant, she is gaining healthy weight and she sees Dr. Dennard Nip her OB/GYN often. She reports that baby is active and moves around most of the day. She denies any bleeding, headaches, nausea, and dizziness. Her diet is unchanged.   Past Medical History:  Diagnosis Date  . ADHD   . Adjustment disorder with mixed disturbance of emotions and conduct    impulsively took pills in context of diagreement with significant other  . Allergic rhinitis   . Amblyopia   . Anemia   . Developmental delay    related to school  . Mild intermittent asthma     History reviewed. No pertinent surgical history.  Family History  Problem Relation Age of Onset  . Asthma Mother   . Sarcoidosis Mother   . Diabetes Maternal Grandmother   . Hypertension Maternal Grandmother   . Asthma Maternal Grandfather   . Diabetes Paternal Grandmother   . Cancer Paternal Grandfather        prostate    Social History:  reports that she has never smoked. She has never used smokeless tobacco. She reports previous alcohol use. She reports that she does not use drugs. She does not smoke or drink. She does not currently work. She has 2 boys,  who are healthy, and is expecting her third.  She lives in [place].  The patient is alone today.  Allergies: No Known Allergies  Current Medications: Current Outpatient Medications  Medication Sig Dispense Refill  . albuterol (PROVENTIL HFA;VENTOLIN HFA) 108 (90 Base) MCG/ACT inhaler Inhale 1-2 puffs into the lungs every 6 (six) hours as needed for wheezing or shortness of breath. 1 Inhaler 3  . Prenatal Vit-Fe Phos-FA-Omega (VITAFOL GUMMIES) 3.33-0.333-34.8 MG CHEW Chew 1 tablet by mouth 2 (two) times daily. 60 tablet 8  . Butalbital-APAP-Caffeine 50-325-40 MG capsule Take 1 capsule by mouth every 6 (six) hours as needed for headache. (Patient not taking: Reported on 02/14/2019) 20 capsule 0   No current facility-administered medications for this visit.     Review of Systems  Constitutional: Negative.  Negative for chills, diaphoresis, fever, malaise/fatigue and weight loss (weight gain appropriate with pregnancy).       Feels "good".  HENT: Negative for congestion, hearing loss, nosebleeds, sinus pain and sore throat.   Eyes: Negative.  Negative for blurred vision, double vision and pain.  Respiratory: Negative.  Negative for cough, sputum production and shortness of breath.   Cardiovascular: Negative.  Negative for chest pain, palpitations, orthopnea, leg swelling and PND.  Gastrointestinal: Negative.  Negative for abdominal pain, blood in stool, constipation, diarrhea, heartburn, melena, nausea and vomiting.  Genitourinary: Negative.  Negative for dysuria, frequency and urgency.  Musculoskeletal: Negative.  Negative for back pain, joint pain and myalgias.  Skin: Negative.  Negative for rash.  Neurological: Negative for dizziness (resolved), tingling, sensory change, focal weakness, weakness and headaches (resolved).  Endo/Heme/Allergies: Negative.  Does not bruise/bleed easily.  Psychiatric/Behavioral: Negative.  Negative for depression and memory loss. The patient is not  nervous/anxious and does not have insomnia.   All other systems reviewed and are negative.  Performance status (ECOG): 1  Blood pressure 120/73, pulse 90, temperature (!) 97.1 F (36.2 C), temperature source Tympanic, resp. rate 18, height 5\' 7"  (1.702 m), weight 178 lb 3.9 oz (80.8 kg), last menstrual period 06/24/2018, SpO2 100 %.   Physical Exam  Constitutional: She is oriented to person, place, and time. She appears well-developed and well-nourished. No distress.  HENT:  Head: Normocephalic and atraumatic.  Mouth/Throat: Oropharynx is clear and moist. No oropharyngeal exudate.  Dark hair pulled up.  Eyes: Pupils are equal, round, and reactive to light. Conjunctivae and EOM are normal. No scleral icterus.  Brown eyes.  Neck: Normal range of motion. Neck supple. No JVD present.  Cardiovascular: Normal rate, regular rhythm and normal heart sounds. Exam reveals no gallop.  No murmur heard. Pulmonary/Chest: Effort normal and breath sounds normal. No respiratory distress. She has no wheezes. She has no rales.  Abdominal: Soft. Bowel sounds are normal. She exhibits no distension. There is no abdominal tenderness. There is no rebound and no guarding.  Pregnant.  Musculoskeletal: Normal range of motion.        General: No tenderness or edema.  Lymphadenopathy:    She has no cervical adenopathy.    She has no axillary adenopathy.       Right: No supraclavicular adenopathy present.       Left: No supraclavicular adenopathy present.  Neurological: She is alert and oriented to person, place, and time.  Skin: Skin is warm and dry. No rash noted. She is not diaphoretic. No erythema. No pallor.  Psychiatric: She has a normal mood and affect. Her behavior is normal. Judgment and thought content normal.  Nursing note and vitals reviewed.   Appointment on 02/14/2019  Component Date Value Ref Range Status  . WBC 02/14/2019 6.6  4.0 - 10.5 K/uL Final  . RBC 02/14/2019 3.75* 3.87 - 5.11 MIL/uL  Final  . Hemoglobin 02/14/2019 8.9* 12.0 - 15.0 g/dL Final  . HCT 16/10/960406/22/2020 28.5* 36.0 - 46.0 % Final  . MCV 02/14/2019 76.0* 80.0 - 100.0 fL Final  . MCH 02/14/2019 23.7* 26.0 - 34.0 pg Final  . MCHC 02/14/2019 31.2  30.0 - 36.0 g/dL Final  . RDW 54/09/811906/22/2020 19.2* 11.5 - 15.5 % Final  . Platelets 02/14/2019 181  150 - 400 K/uL Final  . nRBC 02/14/2019 0.0  0.0 - 0.2 % Final  . Neutrophils Relative % 02/14/2019 61  % Final  . Neutro Abs 02/14/2019 4.1  1.7 - 7.7 K/uL Final  . Lymphocytes Relative 02/14/2019 28  % Final  . Lymphs Abs 02/14/2019 1.9  0.7 - 4.0 K/uL Final  . Monocytes Relative 02/14/2019 9  % Final  . Monocytes Absolute 02/14/2019 0.6  0.1 - 1.0 K/uL Final  . Eosinophils Relative 02/14/2019 1  % Final  . Eosinophils Absolute 02/14/2019 0.0  0.0 - 0.5 K/uL Final  . Basophils Relative 02/14/2019 0  % Final  . Basophils Absolute 02/14/2019 0.0  0.0 - 0.1 K/uL Final  . Immature Granulocytes 02/14/2019 1  % Final  . Abs Immature Granulocytes 02/14/2019 0.03  0.00 -  0.07 K/uL Final   Performed at [facility], [address] [place], [place] [id number]    Assessment:  April LittlerDemia Wyndham is a 23 y.o. female currently [redacted] weeks pregnant with iron deficiency anemia.  She has a history of microcytic anemia dating back to at least 06/15/2014.  MCV has ranged between 62-75.  Hemoglobin electrophoresis was normal on 06/15/2014.  She received Feraheme (05/28/2017 and 06/04/2017) during her second pregnancy and x2 on (01/26/2019 and 02/01/2019).  She is intolerant of oral iron (constipation).  Ferritin has been followed: 115 on 09/11/2014, 5 on 03/17/2017, 5 on 04/07/2017, and 252 on 06/02/2017, 6 on 01/21/2019, and 297 on 02/14/2019.   Symptomatically, she feels good.  Exam is unremarkable.  Hemoglobin is 8.9.  Plan: 1.   Labs today:  CBC with diff, ferritin. 2.   Iron deficiency anemia             Hemoglobin 8.9.  Ferritin pending during appointment.  Patient tolerated  Feraheme.  No bleeding.   3.   RN or MD to call patient with ferritin. 4.   Contact Tresea MallJane Gledhill, CNM with results and possible additional IV iron. 5.   RTC based on above.             Addendum:  Ferritin stores appear replete.  Hemoglobin has not improved significantly.  No evidence of bleeding.  Consider further work-up per gynecology.  [facility] of [facility] and [facility] revealed a hematocrit of 28.9 and hemoglobin 9.1 on 06/12/2017 (after IV iron).  I discussed the assessment and treatment plan with the patient.  The patient was provided an opportunity to ask questions and all were answered.  The patient agreed with the plan and demonstrated an understanding of the instructions.  The patient was advised to call back if the symptoms worsen or if the condition fails to improve as anticipated.  I provided 13 minutes of face-to-face time during this this encounter and > 50% was spent counseling as documented under my assessment and plan.    Rosey Bath C , MD, PhD    02/14/2019, 11:35 AM  I, Theador HawthorneAlexis Patterson, am acting as scribe for [employer] C. Merlene Pullingorcoran, MD, PhD.  I,  C. Merlene Pullingorcoran, MD, have reviewed the above documentation for accuracy and completeness, and I agree with the above.

## 2019-02-15 ENCOUNTER — Telehealth: Payer: Self-pay

## 2019-02-15 NOTE — Telephone Encounter (Signed)
Left a message to inform the patient that her Ferritin has increased form 6 to 297. If she have any question please feel free to contact our office [phone]

## 2019-02-15 NOTE — Telephone Encounter (Signed)
-----   Message from Lequita Asal, MD sent at 02/15/2019  3:37 PM EDT ----- Regarding: Please call patient with ferritin  ----- Message ----- From: Interface, Lab In [place] Sent: 02/14/2019  11:08 AM EDT To: Lequita Asal, MD

## 2019-02-21 ENCOUNTER — Ambulatory Visit (INDEPENDENT_AMBULATORY_CARE_PROVIDER_SITE_OTHER): Payer: Medicaid Other

## 2019-02-21 ENCOUNTER — Other Ambulatory Visit: Payer: Self-pay

## 2019-02-21 ENCOUNTER — Ambulatory Visit (INDEPENDENT_AMBULATORY_CARE_PROVIDER_SITE_OTHER): Payer: Medicaid Other | Admitting: Maternal Newborn

## 2019-02-21 ENCOUNTER — Encounter: Payer: Self-pay | Admitting: Maternal Newborn

## 2019-02-21 VITALS — BP 106/64 | Wt 181.8 lb

## 2019-02-21 DIAGNOSIS — Z23 Encounter for immunization: Secondary | ICD-10-CM

## 2019-02-21 DIAGNOSIS — Z3483 Encounter for supervision of other normal pregnancy, third trimester: Secondary | ICD-10-CM

## 2019-02-21 DIAGNOSIS — Z362 Encounter for other antenatal screening follow-up: Secondary | ICD-10-CM | POA: Diagnosis not present

## 2019-02-21 DIAGNOSIS — Z3480 Encounter for supervision of other normal pregnancy, unspecified trimester: Secondary | ICD-10-CM

## 2019-02-21 DIAGNOSIS — Z3A33 33 weeks gestation of pregnancy: Secondary | ICD-10-CM

## 2019-02-21 LAB — POCT URINALYSIS DIPSTICK OB
Glucose, UA: NEGATIVE
POC,PROTEIN,UA: NEGATIVE

## 2019-02-21 NOTE — Progress Notes (Signed)
ROB/Tdap/BT consent today- no concerns 

## 2019-02-21 NOTE — Progress Notes (Signed)
    Routine Prenatal Care Visit  Subjective  April Clay is a 23 y.o. G3P2002 at [redacted]w[redacted]d being seen today for ongoing prenatal care.  She is currently monitored for the following issues for this low-risk pregnancy and has Allergic rhinitis; Mild intermittent asthma; Anemia; ADHD; Supervision of other normal pregnancy, antepartum; Limited prenatal care, antepartum; and Iron deficiency anemia on their problem list.  ----------------------------------------------------------------------------------- Patient reports no complaints.   Contractions: Not present. Vag. Bleeding: None.  Movement: Present. No leaking of fluid.  ----------------------------------------------------------------------------------- The following portions of the patient's history were reviewed and updated as appropriate: allergies, current medications, past family history, past medical history, past social history, past surgical history and problem list. Problem list updated.   Objective  Blood pressure 106/64, weight 181 lb 12.8 oz (82.5 kg), last menstrual period 06/24/2018. Pregravid weight 155 lb (70.3 kg) Total Weight Gain 26 lb 12.8 oz (12.2 kg)   Urine dipstick shows negative for glucose, protein.  Fetal Status: Fetal Heart Rate (bpm): 156   Movement: Present     General:  Alert, oriented and cooperative. Patient is in no acute distress.  Skin: Skin is warm and dry. No rash noted.   Cardiovascular: Normal heart rate noted  Respiratory: Normal respiratory effort, no problems with respiration noted  Abdomen: Soft, gravid, appropriate for gestational age. Pain/Pressure: Absent     Pelvic:  Cervical exam deferred        Extremities: Normal range of motion.  Edema: None  Mental Status: Normal mood and affect. Normal behavior. Normal judgment and thought content.     Assessment   23 y.o. [id number] at [redacted]w[redacted]d, EDD 04/10/2019 by Ultrasound presenting for a routine prenatal visit.  Plan   pregnancy3 Problems (from  06/24/18 to present)    Problem Noted Resolved   Supervision of other normal pregnancy, antepartum 10/20/2018 by Rexene Agent, CNM No   Overview Addendum 01/13/2019 10:06 AM by Rexene Agent, [facility] Prenatal Labs  Dating Ultrasound 15 w Blood type: A/Positive/-- (02/19 1502)   Genetic Screen Declined Antibody:Negative (02/19 1502)  Anatomic [place] Complete, follow up left ventricle measurement Rubella: 14.20 (02/19 1502) Varicella: Immune  GTT Early:               Third trimester:  RPR: Non Reactive (02/19 1502)   Rhogam N/A HBsAg: Negative (02/19 1502)   TDaP vaccine                       Flu Shot: HIV: Non Reactive (02/19 1502)   Baby Food                                GBS:   Contraception  Pap: 10/13/2018, NILM  CBB     CS/VBAC    Support Person               Follow up ultrasound shows left lateral brain ventricle measurement is now within normal limits and normal anatomy scan is complete. Baby is breech today. Reviewed with patient.  Discussed breastfeeding and she would like to pump colostrum in advance for baby's first days of life. She is aware to avoid doing this before 39 weeks.    Please refer to After Visit Summary for other counseling recommendations.   Return in about 2 weeks (around 03/07/2019) for [place].  Avel Sensor, CNM 02/21/2019  12:06 PM

## 2019-02-21 NOTE — Patient Instructions (Signed)
Third Trimester of Pregnancy The third trimester is from week 28 through week 40 (months 7 through 9). The third trimester is a time when the unborn baby (fetus) is growing rapidly. At the end of the ninth month, the fetus is about 20 inches in length and weighs 6-10 pounds. Body changes during your third trimester Your body will continue to go through many changes during pregnancy. The changes vary from woman to woman. During the third trimester:  Your weight will continue to increase. You can expect to gain 25-35 pounds (11-16 kg) by the end of the pregnancy.  You may begin to get stretch marks on your hips, abdomen, and breasts.  You may urinate more often because the fetus is moving lower into your pelvis and pressing on your bladder.  You may develop or continue to have heartburn. This is caused by increased hormones that slow down muscles in the digestive tract.  You may develop or continue to have constipation because increased hormones slow digestion and cause the muscles that push waste through your intestines to relax.  You may develop hemorrhoids. These are swollen veins (varicose veins) in the rectum that can itch or be painful.  You may develop swollen, bulging veins (varicose veins) in your legs.  You may have increased body aches in the pelvis, back, or thighs. This is due to weight gain and increased hormones that are relaxing your joints.  You may have changes in your hair. These can include thickening of your hair, rapid growth, and changes in texture. Some women also have hair loss during or after pregnancy, or hair that feels dry or thin. Your hair will most likely return to normal after your baby is born.  Your breasts will continue to grow and they will continue to become tender. A yellow fluid (colostrum) may leak from your breasts. This is the first milk you are producing for your baby.  Your belly button may stick out.  You may notice more swelling in your hands,  face, or ankles.  You may have increased tingling or numbness in your hands, arms, and legs. The skin on your belly may also feel numb.  You may feel short of breath because of your expanding uterus.  You may have more problems sleeping. This can be caused by the size of your belly, increased need to urinate, and an increase in your body's metabolism.  You may notice the fetus "dropping," or moving lower in your abdomen (lightening).  You may have increased vaginal discharge.  You may notice your joints feel loose and you may have pain around your pelvic bone. What to expect at prenatal visits You will have prenatal exams every 2 weeks until week 36. Then you will have weekly prenatal exams. During a routine prenatal visit:  You will be weighed to make sure you and the baby are growing normally.  Your blood pressure will be taken.  Your abdomen will be measured to track your baby's growth.  The fetal heartbeat will be listened to.  Any test results from the previous visit will be discussed.  You may have a cervical check near your due date to see if your cervix has softened or thinned (effaced).  You will be tested for Group B streptococcus. This happens between 35 and 37 weeks. Your health care provider may ask you:  What your birth plan is.  How you are feeling.  If you are feeling the baby move.  If you have had any abnormal   symptoms, such as leaking fluid, bleeding, severe headaches, or abdominal cramping.  If you are using any tobacco products, including cigarettes, chewing tobacco, and electronic cigarettes.  If you have any questions. Other tests or screenings that may be performed during your third trimester include:  Blood tests that check for low iron levels (anemia).  Fetal testing to check the health, activity level, and growth of the fetus. Testing is done if you have certain medical conditions or if there are problems during the pregnancy.  Nonstress test  (NST). This test checks the health of your baby to make sure there are no signs of problems, such as the baby not getting enough oxygen. During this test, a belt is placed around your belly. The baby is made to move, and its heart rate is monitored during movement. What is false labor? False labor is a condition in which you feel small, irregular tightenings of the muscles in the womb (contractions) that usually go away with rest, changing position, or drinking water. These are called Braxton Hicks contractions. Contractions may last for hours, days, or even weeks before true labor sets in. If contractions come at regular intervals, become more frequent, increase in intensity, or become painful, you should see your health care provider. What are the signs of labor?  Abdominal cramps.  Regular contractions that start at 10 minutes apart and become stronger and more frequent with time.  Contractions that start on the top of the uterus and spread down to the lower abdomen and back.  Increased pelvic pressure and dull back pain.  A watery or bloody mucus discharge that comes from the vagina.  Leaking of amniotic fluid. This is also known as your "water breaking." It could be a slow trickle or a gush. Let your health care provider know if it has a color or strange odor. If you have any of these signs, call your health care provider right away, even if it is before your due date. Follow these instructions at home: Medicines  Follow your health care provider's instructions regarding medicine use. Specific medicines may be either safe or unsafe to take during pregnancy.  Take a prenatal vitamin that contains at least 600 micrograms (mcg) of folic acid.  If you develop constipation, try taking a stool softener if your health care provider approves. Eating and drinking   Eat a balanced diet that includes fresh fruits and vegetables, whole grains, good sources of protein such as meat, eggs, or tofu,  and low-fat dairy. Your health care provider will help you determine the amount of weight gain that is right for you.  Avoid raw meat and uncooked cheese. These carry germs that can cause birth defects in the baby.  If you have low calcium intake from food, talk to your health care provider about whether you should take a daily calcium supplement.  Eat four or five small meals rather than three large meals a day.  Limit foods that are high in fat and processed sugars, such as fried and sweet foods.  To prevent constipation: ? Drink enough fluid to keep your urine clear or pale yellow. ? Eat foods that are high in fiber, such as fresh fruits and vegetables, whole grains, and beans. Activity  Exercise only as directed by your health care provider. Most women can continue their usual exercise routine during pregnancy. Try to exercise for 30 minutes at least 5 days a week. Stop exercising if you experience uterine contractions.  Avoid heavy lifting.  Do   not exercise in extreme heat or humidity, or at high altitudes.  Wear low-heel, comfortable shoes.  Practice good posture.  You may continue to have sex unless your health care provider tells you otherwise. Relieving pain and discomfort  Take frequent breaks and rest with your legs elevated if you have leg cramps or low back pain.  Take warm sitz baths to soothe any pain or discomfort caused by hemorrhoids. Use hemorrhoid cream if your health care provider approves.  Wear a good support bra to prevent discomfort from breast tenderness.  If you develop varicose veins: ? Wear support pantyhose or compression stockings as told by your healthcare provider. ? Elevate your feet for 15 minutes, 3-4 times a day. Prenatal care  Write down your questions. Take them to your prenatal visits.  Keep all your prenatal visits as told by your health care provider. This is important. Safety  Wear your seat belt at all times when driving.  Make  a list of emergency phone numbers, including numbers for family, friends, the hospital, and police and fire departments. General instructions  Avoid cat litter boxes and soil used by cats. These carry germs that can cause birth defects in the baby. If you have a cat, ask someone to clean the litter box for you.  Do not travel far distances unless it is absolutely necessary and only with the approval of your health care provider.  Do not use hot tubs, steam rooms, or saunas.  Do not drink alcohol.  Do not use any products that contain nicotine or tobacco, such as cigarettes and e-cigarettes. If you need help quitting, ask your health care provider.  Do not use any medicinal herbs or unprescribed drugs. These chemicals affect the formation and growth of the baby.  Do not douche or use tampons or scented sanitary pads.  Do not cross your legs for long periods of time.  To prepare for the arrival of your baby: ? Take prenatal classes to understand, practice, and ask questions about labor and delivery. ? Make a trial run to the hospital. ? Visit the hospital and tour the maternity area. ? Arrange for maternity or paternity leave through employers. ? Arrange for family and friends to take care of pets while you are in the hospital. ? Purchase a rear-facing car seat and make sure you know how to install it in your car. ? Pack your hospital bag. ? Prepare the baby's nursery. Make sure to remove all pillows and stuffed animals from the baby's crib to prevent suffocation.  Visit your dentist if you have not gone during your pregnancy. Use a soft toothbrush to brush your teeth and be gentle when you floss. Contact a health care provider if:  You are unsure if you are in labor or if your water has broken.  You become dizzy.  You have mild pelvic cramps, pelvic pressure, or nagging pain in your abdominal area.  You have lower back pain.  You have persistent nausea, vomiting, or diarrhea.   You have an unusual or bad smelling vaginal discharge.  You have pain when you urinate. Get help right away if:  Your water breaks before 37 weeks.  You have regular contractions less than 5 minutes apart before 37 weeks.  You have a fever.  You are leaking fluid from your vagina.  You have spotting or bleeding from your vagina.  You have severe abdominal pain or cramping.  You have rapid weight loss or weight gain.  You have   shortness of breath with chest pain.  You notice sudden or extreme swelling of your face, hands, ankles, feet, or legs.  Your baby makes fewer than 10 movements in 2 hours.  You have severe headaches that do not go away when you take medicine.  You have vision changes. Summary  The third trimester is from week 28 through week 40, months 7 through 9. The third trimester is a time when the unborn baby (fetus) is growing rapidly.  During the third trimester, your discomfort may increase as you and your baby continue to gain weight. You may have abdominal, leg, and back pain, sleeping problems, and an increased need to urinate.  During the third trimester your breasts will keep growing and they will continue to become tender. A yellow fluid (colostrum) may leak from your breasts. This is the first milk you are producing for your baby.  False labor is a condition in which you feel small, irregular tightenings of the muscles in the womb (contractions) that eventually go away. These are called Braxton Hicks contractions. Contractions may last for hours, days, or even weeks before true labor sets in.  Signs of labor can include: abdominal cramps; regular contractions that start at 10 minutes apart and become stronger and more frequent with time; watery or bloody mucus discharge that comes from the vagina; increased pelvic pressure and dull back pain; and leaking of amniotic fluid. This information is not intended to replace advice given to you by your health  care provider. Make sure you discuss any questions you have with your health care provider. Document Released: 08/05/2001 Document Revised: 12/02/2018 Document Reviewed: 09/16/2016 Elsevier Patient Education  2020 [employer].  

## 2019-03-07 ENCOUNTER — Other Ambulatory Visit: Payer: Self-pay

## 2019-03-07 DIAGNOSIS — D509 Iron deficiency anemia, unspecified: Secondary | ICD-10-CM

## 2019-03-08 ENCOUNTER — Encounter: Payer: Medicaid Other | Admitting: Maternal Newborn

## 2019-03-10 IMAGING — CR DG CHEST 2V
1 series · 2 of 2 positions shown · non-contrast
Comparison: None.

CLINICAL DATA: 22-year-old female with back pain.

EXAM:
CHEST - 2 VIEW

[Series 1: dg chest 2 view · 0.14mm/px · 2 of 2 slices shown]
[im 1/2]
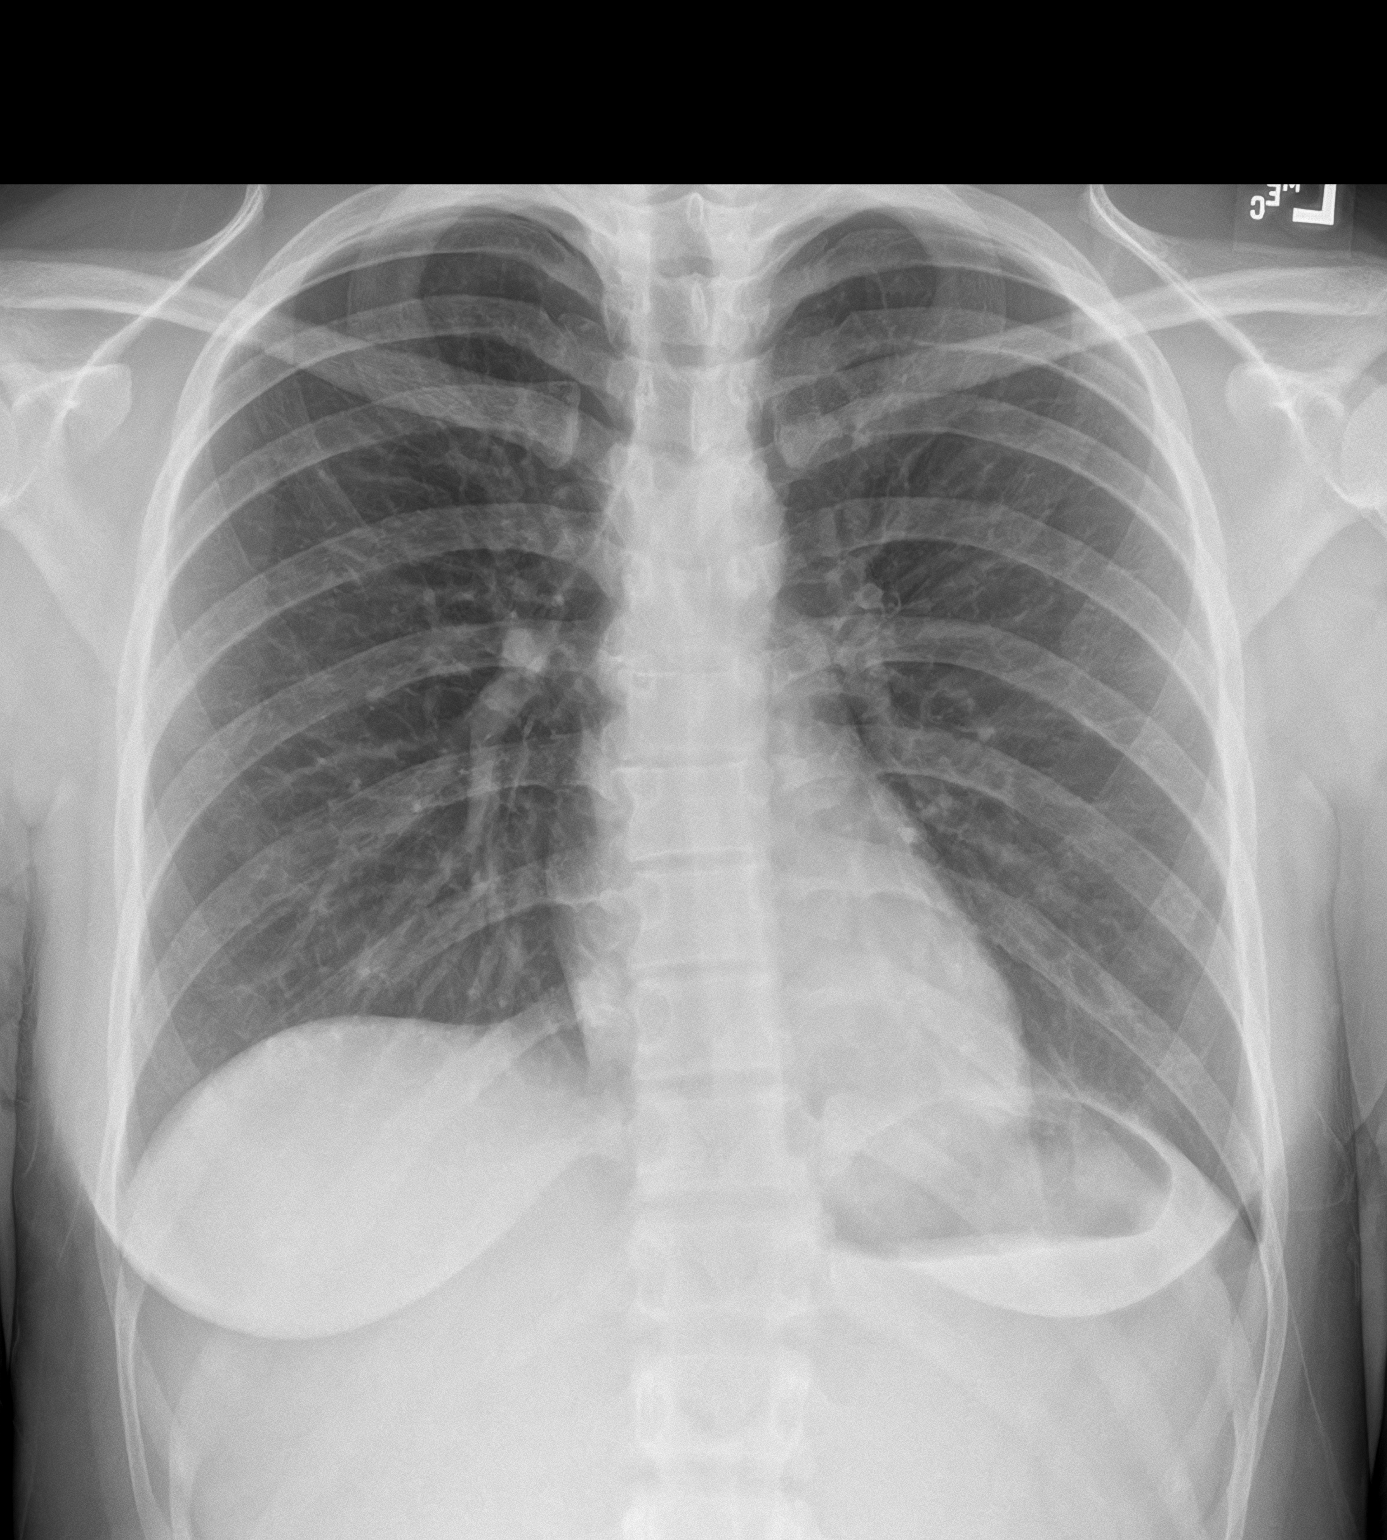
[im 2/2]
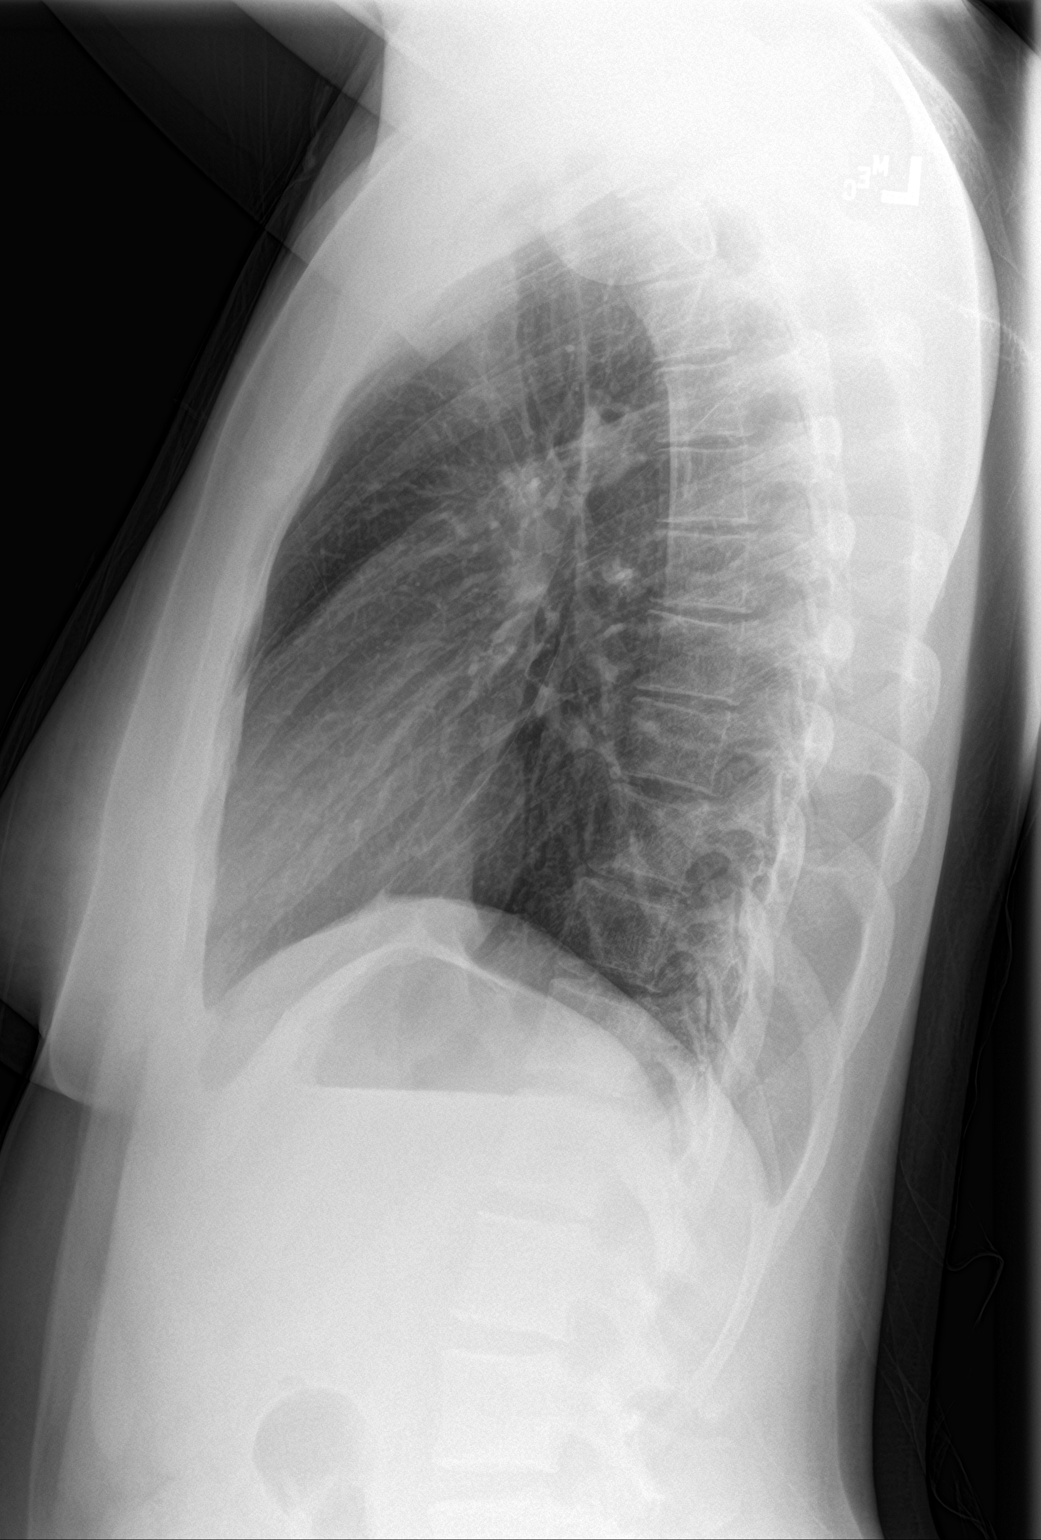

[2 of 2 positions shown; findings below may reference images not displayed]

FINDINGS: The heart size and mediastinal contours are within normal limits.
Both lungs are clear. The visualized skeletal structures are
unremarkable.
IMPRESSION: No active cardiopulmonary disease.

## 2019-03-11 ENCOUNTER — Other Ambulatory Visit (HOSPITAL_COMMUNITY)
Admission: RE | Admit: 2019-03-11 | Discharge: 2019-03-11 | Disposition: A | Discharge status: Home / Self Care | Payer: Medicaid Other | Source: Ambulatory Visit | Attending: Obstetrics and Gynecology | Admitting: Obstetrics and Gynecology

## 2019-03-11 ENCOUNTER — Encounter: Payer: Self-pay | Admitting: Obstetrics and Gynecology

## 2019-03-11 ENCOUNTER — Other Ambulatory Visit: Payer: Self-pay

## 2019-03-11 ENCOUNTER — Ambulatory Visit (INDEPENDENT_AMBULATORY_CARE_PROVIDER_SITE_OTHER): Payer: Medicaid Other | Admitting: Obstetrics and Gynecology

## 2019-03-11 VITALS — BP 120/60 | Wt 184.0 lb

## 2019-03-11 DIAGNOSIS — Z3480 Encounter for supervision of other normal pregnancy, unspecified trimester: Secondary | ICD-10-CM | POA: Diagnosis present

## 2019-03-11 DIAGNOSIS — O0933 Supervision of pregnancy with insufficient antenatal care, third trimester: Secondary | ICD-10-CM

## 2019-03-11 DIAGNOSIS — Z3A35 35 weeks gestation of pregnancy: Secondary | ICD-10-CM

## 2019-03-11 LAB — OB RESULTS CONSOLE GC/CHLAMYDIA: Gonorrhea: NEGATIVE

## 2019-03-11 LAB — OB RESULTS CONSOLE GBS: GBS: NEGATIVE

## 2019-03-11 NOTE — Progress Notes (Signed)
ROB No concerns  Denies lof, no vb and Good FM 

## 2019-03-11 NOTE — Patient Instructions (Signed)
 COVID-19 and Your Pregnancy FAQ  How can I prevent infection with COVID-19 during my pregnancy? Social distancing is key. Please limit any interactions in public. Try and work from home if possible. Frequently wash your hands after touching possibly contaminated surfaces. Avoid touching your face.  Minimize trips to the store. Consider online ordering when possible.   Should I wear a mask? YES. It is recommended by the CDC that all people wear a cloth mask or facial covering in public. You should wear a mask to your visits in the office. This will help reduce transmission as well as your risk or acquiring COVID-19. New studies are showing that even asymptomatic individuals can spread the virus from talking.   Where can I get a mask? [place] and the city of [place] are partnering to provide masks to community members. You can pick up a mask from several locations. This website also has instructions about how to make a mask by sewing or without sewing by using a t-shirt or bandana.  https://www.[place].gov/i-want-to/learn-about/covid-19-information-and-updates/covid-19-face-mask-project  Studies have shown that if you were a tube or nylon stocking from pantyhose over a cloth mask it makes the cloth mask almost as effective as a N95 mask.  https://www.npr.org/sections/goatsandsoda/2018/12/15/840146830/adding-a-nylon-stocking-layer-could-boost-protection-from-cloth-masks-study-find  What are the symptoms of COVID-19? Fever (greater than 100.4 F), dry cough, shortness of breath.  Am I more at risk for COVID-19 since I am pregnant? There is not currently data showing that pregnant women are more adversely impacted by COVID-19 than the general population. However, we know that pregnant women tend to have worse respiratory complications from similar diseases such as the flu and SARS and for this reason should be considered an at-risk population.  What do I do if I am experiencing the  symptoms of COVID-19? Testing is being limited because of test availability. If you are experiencing symptoms you should quarantine yourself, and the members of your family, for at least 2 weeks at home.   Please visit this website for more information: https://www.cdc.gov/coronavirus/2019-ncov/if-you-are-sick/steps-when-sick.html  When should I go to the Emergency Room? Please go to the emergency room if you are experiencing ANY of these symptoms*:  1.    Difficulty breathing or shortness of breath 2.    Persistent pain or pressure in the chest 3.    Confusion or difficulty being aroused (or awakened) 4.    Bluish lips or face  *This list is not all inclusive. Please consult our office for any other symptoms that are severe or concerning.  What do I do if I am having difficulty breathing? You should go to the Emergency Room for evaluation. At this time they have a tent set up for evaluating patients with COVID-19 symptoms.   How will my prenatal care be different because of the COVID-19 pandemic? It has been recommended to reduce the frequency of face-to-face visits and use resources such as telephone and virtual visits when possible. Using a scale, blood pressure machine and fetal doppler at home can further help reduce face-to-face visits. You will be provided with additional information on this topic.  We ask that you come to your visits alone to minimize potential exposures to  COVID-19.  How can I receive childbirth education? At this time in-person classes have been cancelled. You can register for online childbirth education, breastfeeding, and newborn care classes.  Please visit:  [email or web address] for more information  How will my hospital birth experience be different? The hospital is currently limiting visitors. This means that while you are in   labor you can only have one person at the hospital with you. Additional family members will not be allowed to wait in the  building or outside your room. Your one support person can be the father of the baby, a relative, a doula, or a friend. Once one support person is designated that person will wear a band. This band cannot be shared with multiple people.  Nitrous Gas is not being offered for pain relief since the tubing and filter for the machine can not be sanitized in a way to guarantee prevention of transmission of COVID-19.  Nasal cannula use of oxygen for fetal indications has also been discontinued.  Currently a clear plastic sheet is being hung between mom and the delivering provider during pushing and delivery to help prevent transmission of COVID-19.      How long will I stay in the hospital for after giving birth? It is also recommended that discharge home be expedited during the COVID-19 outbreak. This means staying for 1 day after a vaginal delivery and 2 days after a cesarean section. Patients who need to stay longer for medical reasons are allowed to do so, but the goal will be for expedited discharge home.   What if I have COVID-19 and I am in labor? We ask that you wear a mask while on labor and delivery. We will try and accommodate you being placed in a room that is capable of filtering the air. Please call ahead if you are in labor and on your way to the hospital. The phone number for labor and delivery at [facility] is [phone].  If I have COVID-19 when my baby is born how can I prevent my baby from contracting COVID-19? This is an issue that will have to be discussed on a case-by-case basis. Current recommendations suggest providing separate isolation rooms for both the mother and new infant as well as limiting visitors. However, there are practical challenges to this recommendation. The situation will assuredly change and decisions will be influenced by the desires of the mother and availability of space.  Some suggestions are the use of a curtain or physical barrier  between mom and infant, hand hygiene, mom wearing a mask, or 6 feet of spacing between a mom and infant.   Can I breastfeed during the COVID-19 pandemic?   Yes, breastfeeding is encouraged.  Can I breastfeed if I have COVID-19? Yes. Covid-19 has not been found in breast milk. This means you cannot give COVID-19 to your child through breast milk. Breast feeding will also help pass antibodies to fight infection to your baby.   What precautions should I take when breastfeeding if I have COVID-19? If a mother and newborn do room-in and the mother wishes to feed at the breast, she should put on a facemask and practice hand hygiene before each feeding.  What precautions should I take when pumping if I have COVID-19? Prior to expressing breast milk, mothers should practice hand hygiene. After each pumping session, all parts that come into contact with breast milk should be thoroughly washed and the entire pump should be appropriately disinfected per the manufacturer's instructions. This expressed breast milk should be fed to the newborn by a healthy caregiver.  What if I am pregnant and work in healthcare? Based on limited data regarding COVID-19 and pregnancy, ACOG currently does not propose creating additional restrictions on pregnant health care personnel because of COVID-19 alone. Pregnant women do not appear to be at higher   risk of severe disease related to COVID-19. Pregnant health care personnel should follow CDC risk assessment and infection control guidelines for health care personnel exposed to patients with suspected or confirmed COVID-19. Adherence to recommended infection prevention and control practices is an important part of protecting all health care personnel in health care settings.    Information on COVID-19 in pregnancy is very limited; however, facilities may want to consider limiting exposure of pregnant health care personnel to patients with confirmed or suspected COVID-19  infection, especially during higher-risk procedures (eg, aerosol-generating procedures), if feasible, based on staffing availability.    

## 2019-03-11 NOTE — Progress Notes (Signed)
    Routine Prenatal Care Visit  Subjective  April Clay is a 23 y.o. G3P2002 at [redacted]w[redacted]d being seen today for ongoing prenatal care.  She is currently monitored for the following issues for this low-risk pregnancy and has Allergic rhinitis; Mild intermittent asthma; Anemia; ADHD; Supervision of other normal pregnancy, antepartum; Limited prenatal care, antepartum; and Iron deficiency anemia on their problem list.  ----------------------------------------------------------------------------------- Patient reports no complaints.   Contractions: Not present. Vag. Bleeding: None.  Movement: Present. Denies leaking of fluid.  ----------------------------------------------------------------------------------- The following portions of the patient's history were reviewed and updated as appropriate: allergies, current medications, past family history, past medical history, past social history, past surgical history and problem list. Problem list updated.   Objective  Blood pressure 120/60, weight 184 lb (83.5 kg), last menstrual period 06/24/2018. Pregravid weight 155 lb (70.3 kg) Total Weight Gain 29 lb (13.2 kg) Urinalysis:      Fetal Status: Fetal Heart Rate (bpm): 160 Fundal Height: 35 cm Movement: Present  Presentation: Vertex  General:  Alert, oriented and cooperative. Patient is in no acute distress.  Skin: Skin is warm and dry. No rash noted.   Cardiovascular: Normal heart rate noted  Respiratory: Normal respiratory effort, no problems with respiration noted  Abdomen: Soft, gravid, appropriate for gestational age. Pain/Pressure: Absent     Pelvic:  Cervical exam performed Dilation: Closed Effacement (%): Thick Station: -3  Extremities: Normal range of motion.  Edema: None  Mental Status: Normal mood and affect. Normal behavior. Normal judgment and thought content.     Assessment   23 y.o. [id number] at [redacted]w[redacted]d by  04/10/2019, by Ultrasound presenting for routine prenatal visit  Plan    pregnancy3 Problems (from 06/24/18 to present)    Problem Noted Resolved   Supervision of other normal pregnancy, antepartum 10/20/2018 by Rexene Agent, CNM No   Overview Addendum 02/21/2019 12:06 PM by Rexene Agent, [place] Prenatal Labs  Dating Ultrasound 15 w Blood type: A/Positive/-- (02/19 1502)   Genetic Screen Declined Antibody:Negative (02/19 1502)  Anatomic [place] Complete 6/29 Rubella: 14.20 (02/19 1502) Varicella: Immune  GTT Early: N/A             Third trimester: 75 RPR: Non Reactive (02/19 1502)   Rhogam N/A HBsAg: Negative (02/19 1502)   TDaP vaccine  02/21/2019                     Flu Shot: HIV: Non Reactive (02/19 1502)   Baby Food Breast                               GBS:   Contraception  Pap: 10/13/2018, NILM  CBB     CS/VBAC    Support Person                  Gestational age appropriate obstetric precautions including but not limited to vaginal bleeding, contractions, leaking of fluid and fetal movement were reviewed in detail with the patient.    GBS/ GC ans Ct today Vertex  Return in about 1 week (around 03/18/2019) for ROB in person.  Homero Fellers MD [facility] 03/11/2019, 11:37 AM

## 2019-03-15 LAB — CERVICOVAGINAL ANCILLARY ONLY
Chlamydia: NEGATIVE
Neisseria Gonorrhea: NEGATIVE

## 2019-03-15 LAB — CULTURE, BETA STREP (GROUP B ONLY): Strep Gp B Culture: NEGATIVE

## 2019-03-18 ENCOUNTER — Other Ambulatory Visit: Payer: Self-pay

## 2019-03-18 ENCOUNTER — Encounter: Payer: Self-pay | Admitting: Advanced Practice Midwife

## 2019-03-18 ENCOUNTER — Ambulatory Visit (INDEPENDENT_AMBULATORY_CARE_PROVIDER_SITE_OTHER): Payer: Medicaid Other | Admitting: Advanced Practice Midwife

## 2019-03-18 VITALS — BP 128/58 | Wt 186.0 lb

## 2019-03-18 DIAGNOSIS — Z113 Encounter for screening for infections with a predominantly sexual mode of transmission: Secondary | ICD-10-CM

## 2019-03-18 DIAGNOSIS — O99013 Anemia complicating pregnancy, third trimester: Secondary | ICD-10-CM

## 2019-03-18 DIAGNOSIS — Z3A36 36 weeks gestation of pregnancy: Secondary | ICD-10-CM

## 2019-03-18 DIAGNOSIS — D649 Anemia, unspecified: Secondary | ICD-10-CM

## 2019-03-18 DIAGNOSIS — Z3685 Encounter for antenatal screening for Streptococcus B: Secondary | ICD-10-CM

## 2019-03-18 LAB — POCT URINALYSIS DIPSTICK OB
Glucose, UA: NEGATIVE
POC,PROTEIN,UA: NEGATIVE

## 2019-03-18 NOTE — Patient Instructions (Signed)
Braxton Hicks Contractions Contractions of the uterus can occur throughout pregnancy, but they are not always a sign that you are in labor. You may have practice contractions called Braxton Hicks contractions. These false labor contractions are sometimes confused with true labor. What are Braxton Hicks contractions? Braxton Hicks contractions are tightening movements that occur in the muscles of the uterus before labor. Unlike true labor contractions, these contractions do not result in opening (dilation) and thinning of the cervix. Toward the end of pregnancy (32-34 weeks), Braxton Hicks contractions can happen more often and may become stronger. These contractions are sometimes difficult to tell apart from true labor because they can be very uncomfortable. You should not feel embarrassed if you go to the hospital with false labor. Sometimes, the only way to tell if you are in true labor is for your health care provider to look for changes in the cervix. The health care provider will do a physical exam and may monitor your contractions. If you are not in true labor, the exam should show that your cervix is not dilating and your water has not broken. If there are no other health problems associated with your pregnancy, it is completely safe for you to be sent home with false labor. You may continue to have Braxton Hicks contractions until you go into true labor. How to tell the difference between true labor and false labor True labor  Contractions last 30-70 seconds.  Contractions become very regular.  Discomfort is usually felt in the top of the uterus, and it spreads to the lower abdomen and low back.  Contractions do not go away with walking.  Contractions usually become more intense and increase in frequency.  The cervix dilates and gets thinner. False labor  Contractions are usually shorter and not as strong as true labor contractions.  Contractions are usually irregular.  Contractions  are often felt in the front of the lower abdomen and in the groin.  Contractions may go away when you walk around or change positions while lying down.  Contractions get weaker and are shorter-lasting as time goes on.  The cervix usually does not dilate or become thin. Follow these instructions at home:   Take over-the-counter and prescription medicines only as told by your health care provider.  Keep up with your usual exercises and follow other instructions from your health care provider.  Eat and drink lightly if you think you are going into labor.  If Braxton Hicks contractions are making you uncomfortable: ? Change your position from lying down or resting to walking, or change from walking to resting. ? Sit and rest in a tub of warm water. ? Drink enough fluid to keep your urine pale yellow. Dehydration may cause these contractions. ? Do slow and deep breathing several times an hour.  Keep all follow-up prenatal visits as told by your health care provider. This is important. Contact a health care provider if:  You have a fever.  You have continuous pain in your abdomen. Get help right away if:  Your contractions become stronger, more regular, and closer together.  You have fluid leaking or gushing from your vagina.  You pass blood-tinged mucus (bloody show).  You have bleeding from your vagina.  You have low back pain that you never had before.  You feel your baby's head pushing down and causing pelvic pressure.  Your baby is not moving inside you as much as it used to. Summary  Contractions that occur before labor are   called Braxton Hicks contractions, false labor, or practice contractions.  Braxton Hicks contractions are usually shorter, weaker, farther apart, and less regular than true labor contractions. True labor contractions usually become progressively stronger and regular, and they become more frequent.  Manage discomfort from Braxton Hicks contractions  by changing position, resting in a warm bath, drinking plenty of water, or practicing deep breathing. This information is not intended to replace advice given to you by your health care provider. Make sure you discuss any questions you have with your health care provider. Document Released: 12/25/2016 Document Revised: 07/24/2017 Document Reviewed: 12/25/2016 Elsevier Patient Education  2020 [employer]  

## 2019-03-18 NOTE — Progress Notes (Signed)
ROB

## 2019-03-18 NOTE — Progress Notes (Signed)
Routine Prenatal Care Visit  Subjective  April Clay is a 23 y.o. G3P2002 at 2931w5d being seen today for ongoing prenatal care.  She is currently monitored for the following issues for this low-risk pregnancy and has Allergic rhinitis; Mild intermittent asthma; Anemia; ADHD; Supervision of other normal pregnancy, antepartum; Limited prenatal care, antepartum; and Iron deficiency anemia on their problem list.  ----------------------------------------------------------------------------------- Patient reports she did not answer call or listen to message from Hematology regarding follow up labs for low H/H, improved Ferritin following infusions. She reports feeling well after the 2 iron infusions she has had. She would like to follow up with hematology after the pregnancy regarding iron labs. Patient is encouraged to call hematology regarding plan of care and to take her iron supplement.   Discussed the case with Dr Merlene Pullingorcoran who mentioned this was the same response the patient had with previous pregnancy- improved ferritin and little increase in H/H.   Contractions: Not present. Vag. Bleeding: None.  Movement: Present. Denies leaking of fluid.  ----------------------------------------------------------------------------------- The following portions of the patient's history were reviewed and updated as appropriate: allergies, current medications, past family history, past medical history, past social history, past surgical history and problem list. Problem list updated.   Objective  Blood pressure (!) 128/58, weight 186 lb (84.4 kg), last menstrual period 06/24/2018. Pregravid weight 155 lb (70.3 kg) Total Weight Gain 31 lb (14.1 kg) Urinalysis: Urine Protein Negative  Urine Glucose Negative  Fetal Status: Fetal Heart Rate (bpm): 155 Fundal Height: 37 cm Movement: Present     General:  Alert, oriented and cooperative. Patient is in no acute distress.  Skin: Skin is warm and dry. No rash noted.    Cardiovascular: Normal heart rate noted  Respiratory: Normal respiratory effort, no problems with respiration noted  Abdomen: Soft, gravid, appropriate for gestational age. Pain/Pressure: Absent     Pelvic:  Cervical exam deferred        Extremities: Normal range of motion.  Edema: None  Mental Status: Normal mood and affect. Normal behavior. Normal judgment and thought content.   Assessment   23 y.o. [id number] at 1731w5d by  04/10/2019, by Ultrasound presenting for routine prenatal visit  Plan   pregnancy3 Problems (from 06/24/18 to present)    Problem Noted Resolved   Supervision of other normal pregnancy, antepartum 10/20/2018 by Oswaldo ConroySchmid, Jacelyn Y, CNM No   Overview Addendum 02/21/2019 12:06 PM by Oswaldo ConroySchmid, Jacelyn Y, CNM    Clinic [facility] Prenatal Labs  Dating Ultrasound 15 w Blood type: A/Positive/-- (02/19 1502)   Genetic Screen Declined Antibody:Negative (02/19 1502)  Anatomic US Complete 6/29 Rubella: 14.20 (02/19 1502) Varicella: Immune  GTT Early: N/A             Third trimester: 74 RPR: Non Reactive (02/19 1502)   Rhogam N/A HBsAg: Negative (02/19 1502)   TDaP vaccine  02/21/2019                     Flu Shot: HIV: Non Reactive (02/19 1502)   Baby Food Breast                               GBS:   Contraception  Pap: 10/13/2018, NILM  CBB     CS/VBAC    Support Person                  Preterm labor symptoms and general obstetric precautions including but not  limited to vaginal bleeding, contractions, leaking of fluid and fetal movement were reviewed in detail with the patient. Please refer to After Visit Summary for other counseling recommendations.   Anemia: continue taking iron supplement and eating iron rich foods  Return in about 1 week (around 03/25/2019) for rob.  Rod Can, CNM 03/18/2019 11:43 AM

## 2019-03-23 ENCOUNTER — Other Ambulatory Visit: Payer: Self-pay

## 2019-03-23 DIAGNOSIS — Z20822 Contact with and (suspected) exposure to covid-19: Secondary | ICD-10-CM

## 2019-03-25 ENCOUNTER — Encounter: Payer: Medicaid Other | Admitting: Advanced Practice Midwife

## 2019-03-25 LAB — NOVEL CORONAVIRUS, NAA: SARS-CoV-2, NAA: NOT DETECTED

## 2019-03-26 ENCOUNTER — Telehealth: Payer: Self-pay

## 2019-03-26 NOTE — Telephone Encounter (Signed)
Called and informed patient that test for Covid 19 was NEGATIVE. Discussed signs and symptoms of Covid 19 : fever, chills, respiratory symptoms, cough, ENT symptoms, sore throat, SOB, muscle pain, diarrhea, headache, loss of taste/smell, close exposure to COVID-19 patient. Pt instructed to call PCP if they develop the above signs and sx. Pt also instructed to call 911 if having respiratory issues/distress. Discussed MyChart enrollment. Pt verbalized understanding.  

## 2019-04-01 ENCOUNTER — Encounter: Payer: Self-pay | Admitting: Advanced Practice Midwife

## 2019-04-01 ENCOUNTER — Ambulatory Visit (INDEPENDENT_AMBULATORY_CARE_PROVIDER_SITE_OTHER): Payer: Medicaid Other | Admitting: Advanced Practice Midwife

## 2019-04-01 ENCOUNTER — Other Ambulatory Visit: Payer: Self-pay

## 2019-04-01 VITALS — BP 120/70 | Wt 191.0 lb

## 2019-04-01 DIAGNOSIS — Z3A38 38 weeks gestation of pregnancy: Secondary | ICD-10-CM

## 2019-04-01 DIAGNOSIS — O0933 Supervision of pregnancy with insufficient antenatal care, third trimester: Secondary | ICD-10-CM

## 2019-04-01 LAB — POCT URINALYSIS DIPSTICK OB
Glucose, UA: NEGATIVE
POC,PROTEIN,UA: NEGATIVE

## 2019-04-01 NOTE — Progress Notes (Signed)
  Routine Prenatal Care Visit  Subjective  April Clay is a 23 y.o. G3P2002 at [redacted]w[redacted]d being seen today for ongoing prenatal care.  She is currently monitored for the following issues for this low-risk pregnancy and has Allergic rhinitis; Mild intermittent asthma; Anemia; ADHD; Supervision of other normal pregnancy, antepartum; Limited prenatal care, antepartum; and Iron deficiency anemia on their problem list.  ----------------------------------------------------------------------------------- Patient reports no complaints.  Patient was with her sister recently who tested positive for Covid. Patient's test was negative. Contractions: Not present. Vag. Bleeding: None.  Movement: Present. Denies leaking of fluid.  ----------------------------------------------------------------------------------- The following portions of the patient's history were reviewed and updated as appropriate: allergies, current medications, past family history, past medical history, past social history, past surgical history and problem list. Problem list updated.   Objective  Blood pressure 120/70, weight 191 lb (86.6 kg), last menstrual period 06/24/2018. Pregravid weight 155 lb (70.3 kg) Total Weight Gain 36 lb (16.3 kg) Urinalysis: Urine Protein    Urine Glucose    Fetal Status: Fetal Heart Rate (bpm): 138 Fundal Height: 39 cm Movement: Present     General:  Alert, oriented and cooperative. Patient is in no acute distress.  Skin: Skin is warm and dry. No rash noted.   Cardiovascular: Normal heart rate noted  Respiratory: Normal respiratory effort, no problems with respiration noted  Abdomen: Soft, gravid, appropriate for gestational age. Pain/Pressure: Present     Pelvic:  Cervical exam performed Dilation: 1 Effacement (%): Thick    Extremities: Normal range of motion.  Edema: None  Mental Status: Normal mood and affect. Normal behavior. Normal judgment and thought content.   Assessment   23 y.o. [id number] at  [redacted]w[redacted]d by  04/10/2019, by Ultrasound presenting for routine prenatal visit  Plan   pregnancy3 Problems (from 06/24/18 to present)    Problem Noted Resolved   Supervision of other normal pregnancy, antepartum 10/20/2018 by Rexene Agent, CNM No   Overview Addendum 02/21/2019 12:06 PM by Rexene Agent, [facility] Prenatal Labs  Dating Ultrasound 15 w Blood type: A/Positive/-- (02/19 1502)   Genetic Screen Declined Antibody:Negative (02/19 1502)  Anatomic [place] Complete 6/29 Rubella: 14.20 (02/19 1502) Varicella: Immune  GTT Early: N/A             Third trimester: 74 RPR: Non Reactive (02/19 1502)   Rhogam N/A HBsAg: Negative (02/19 1502)   TDaP vaccine  02/21/2019                     Flu Shot: HIV: Non Reactive (02/19 1502)   Baby Food Breast                               GBS:   Contraception  Pap: 10/13/2018, NILM  CBB     CS/VBAC    Support Person                  Term labor symptoms and general obstetric precautions including but not limited to vaginal bleeding, contractions, leaking of fluid and fetal movement were reviewed in detail with the patient.    Return in about 1 week (around 04/08/2019) for rob.  Rod Can, CNM 04/01/2019 4:28 PM

## 2019-04-01 NOTE — Progress Notes (Signed)
ROB- no concerns 

## 2019-04-06 ENCOUNTER — Telehealth: Payer: Self-pay

## 2019-04-06 NOTE — Telephone Encounter (Signed)
Pt calling; needs rx; no openings 'til Monday; needs to start it Sunday.  [phone]  Called pt to see what med she needed.  She states she was give a pill a year or so ago for yeast.  She doesn't think this is yeast - it doesn't itch, not a funny color, just odor and uncomfortable.  Adv that pill is for yeast and it's doesn't cover every kind of yeast.  She can try Monistat 3d which is for yeast but may help get her to appt on Monday.  Adv we couldn't rx her something else without seeing her.

## 2019-04-11 ENCOUNTER — Encounter: Payer: Self-pay | Admitting: Maternal Newborn

## 2019-04-11 ENCOUNTER — Ambulatory Visit (INDEPENDENT_AMBULATORY_CARE_PROVIDER_SITE_OTHER): Payer: Medicaid Other | Admitting: Maternal Newborn

## 2019-04-11 ENCOUNTER — Other Ambulatory Visit: Payer: Self-pay

## 2019-04-11 VITALS — BP 112/60 | Wt 191.0 lb

## 2019-04-11 DIAGNOSIS — Z3A40 40 weeks gestation of pregnancy: Secondary | ICD-10-CM

## 2019-04-11 DIAGNOSIS — O480 Post-term pregnancy: Secondary | ICD-10-CM

## 2019-04-11 DIAGNOSIS — Z3480 Encounter for supervision of other normal pregnancy, unspecified trimester: Secondary | ICD-10-CM

## 2019-04-11 NOTE — Progress Notes (Signed)
Routine Prenatal Care Visit  Subjective  April Clay is a 23 y.o. G3P2002 at 127w1d being seen today for ongoing prenatal care.  She is currently monitored for the following issues for this low-risk pregnancy and has Allergic rhinitis; Mild intermittent asthma; Anemia; ADHD; Supervision of other normal pregnancy, antepartum; Limited prenatal care, antepartum; and Iron deficiency anemia on their problem list.  ----------------------------------------------------------------------------------- Patient reports that she had a thick vaginal discharge, consistent with a yeast infection. She used OTC cream (Monistat 3) and symptoms have improved.  Occasional Braxton-Hicks contractions. Contractions: Not present. Vag. Bleeding: None.  Movement: Present. No leaking of fluid.  ----------------------------------------------------------------------------------- The following portions of the patient's history were reviewed and updated as appropriate: allergies, current medications, past family history, past medical history, past social history, past surgical history and problem list. Problem list updated.   Objective  Blood pressure 140/80, weight 191 lb (86.6 kg), last menstrual period 06/24/2018. Pregravid weight 155 lb (70.3 kg) Total Weight Gain 36 lb (16.3 kg)  Fetal Status: Fetal Heart Rate (bpm): 150   Movement: Present     General:  Alert, oriented and cooperative. Patient is in no acute distress.  Skin: Skin is warm and dry. No rash noted.   Cardiovascular: Normal heart rate noted  Respiratory: Normal respiratory effort, no problems with respiration noted  Abdomen: Soft, gravid, appropriate for gestational age. Pain/Pressure: Present     Pelvic:  Cervical exam performed Dilation: 1 Effacement (%): 30 Station: -2  Extremities: Normal range of motion.  Edema: None  Mental Status: Normal mood and affect. Normal behavior. Normal judgment and thought content.   Wet Prep: PH: <4 Clue Cells:  Negative Fungal elements: Negative Trichomonas: Negative  Assessment   23 y.o. [id number] at 337w1d, EDD 04/10/2019 by Ultrasound presenting for a work-in prenatal visit.  Plan   pregnancy3 Problems (from 06/24/18 to present)    Problem Noted Resolved   Supervision of other normal pregnancy, antepartum 10/20/2018 by Oswaldo ConroySchmid,  Y, CNM No   Overview Addendum 02/21/2019 12:06 PM by Oswaldo ConroySchmid,  Y, CNM    Clinic [facility] Prenatal Labs  Dating Ultrasound 15 w Blood type: A/Positive/-- (02/19 1502)   Genetic Screen Declined Antibody:Negative (02/19 1502)  Anatomic US Complete 6/29 Rubella: 14.20 (02/19 1502) Varicella: Immune  GTT Early: N/A             Third trimester: 74 RPR: Non Reactive (02/19 1502)   Rhogam N/A HBsAg: Negative (02/19 1502)   TDaP vaccine  02/21/2019                     Flu Shot: HIV: Non Reactive (02/19 1502)   Baby Food Breast                               GBS:   Contraception  Pap: 10/13/2018, NILM  CBB     CS/VBAC    Support Person                 Wet prep was negative today. Second BP reading 112/60. Patient declines a scheduled induction at this time. She plans to return to the office later this week and would be open to scheduling an induction at that time if no labor.  Term labor symptoms and general obstetric precautions including but not limited to vaginal bleeding, contractions, leaking of fluid and fetal movement were reviewed.  Please refer to After Visit Summary for other  counseling recommendations.   Return in about 4 days (around 04/15/2019) for ROB/NST.  Avel Sensor, CNM 04/11/2019  5:02 PM

## 2019-04-13 ENCOUNTER — Other Ambulatory Visit: Payer: Self-pay | Admitting: Maternal Newborn

## 2019-04-13 ENCOUNTER — Telehealth: Payer: Self-pay

## 2019-04-13 DIAGNOSIS — Z01818 Encounter for other preprocedural examination: Secondary | ICD-10-CM

## 2019-04-13 NOTE — Progress Notes (Signed)
Induction scheduled for 8/22 0500. Patient has appointment in office on 8/21 for H&P. Pre-admission COVID test ordered and patient aware.

## 2019-04-13 NOTE — Telephone Encounter (Signed)
Patient requesting to set up an induction appointment. She doesn't feel like waiting anymore. DS#[phone]

## 2019-04-14 ENCOUNTER — Other Ambulatory Visit
Admission: RE | Admit: 2019-04-14 | Discharge: 2019-04-14 | Disposition: A | Discharge status: Home / Self Care | Payer: Medicaid Other | Source: Ambulatory Visit | Attending: Maternal Newborn | Admitting: Maternal Newborn

## 2019-04-14 ENCOUNTER — Other Ambulatory Visit: Payer: Self-pay

## 2019-04-14 DIAGNOSIS — Z01812 Encounter for preprocedural laboratory examination: Secondary | ICD-10-CM | POA: Insufficient documentation

## 2019-04-14 DIAGNOSIS — Z20828 Contact with and (suspected) exposure to other viral communicable diseases: Secondary | ICD-10-CM | POA: Diagnosis not present

## 2019-04-14 DIAGNOSIS — Z20822 Contact with and (suspected) exposure to covid-19: Secondary | ICD-10-CM

## 2019-04-14 LAB — SARS CORONAVIRUS 2 (TAT 6-24 HRS): SARS Coronavirus 2: NEGATIVE

## 2019-04-15 ENCOUNTER — Encounter: Payer: Self-pay | Admitting: Maternal Newborn

## 2019-04-15 ENCOUNTER — Ambulatory Visit (INDEPENDENT_AMBULATORY_CARE_PROVIDER_SITE_OTHER): Payer: Medicaid Other | Admitting: Maternal Newborn

## 2019-04-15 VITALS — BP 120/70 | Wt 195.0 lb

## 2019-04-15 DIAGNOSIS — Z3480 Encounter for supervision of other normal pregnancy, unspecified trimester: Secondary | ICD-10-CM

## 2019-04-15 DIAGNOSIS — Z3A40 40 weeks gestation of pregnancy: Secondary | ICD-10-CM

## 2019-04-15 DIAGNOSIS — O480 Post-term pregnancy: Secondary | ICD-10-CM

## 2019-04-15 NOTE — Progress Notes (Signed)
ROB/NST- pt would like cervix check

## 2019-04-15 NOTE — Patient Instructions (Signed)

## 2019-04-15 NOTE — Progress Notes (Signed)
Obstetrics Admission History & Physical     HPI:  23 y.o. [id number] @ [redacted]w[redacted]d (04/10/2019, by Ultrasound). Admitted on (Not on file):   Patient Active Problem List   Diagnosis Date Noted  . Iron deficiency anemia 01/21/2019  . Limited prenatal care, antepartum 01/13/2019  . Supervision of other normal pregnancy, antepartum 10/20/2018  . Allergic rhinitis   . Mild intermittent asthma   . Anemia   . ADHD      Presents for planned induction of labor, postdates. She has been having some infrequent, mild contractions. No loss of fluid or vaginal bleeding. Baby has been moving well.   [facility] Pregnancy complicated by anemia.  ROS: A review of systems was performed and negative, except as stated in the above HPI. PMHx:  Past Medical History:  Diagnosis Date  . ADHD   . Adjustment disorder with mixed disturbance of emotions and conduct    impulsively took pills in context of diagreement with significant other  . Allergic rhinitis   . Amblyopia   . Anemia   . Developmental delay    related to school  . Mild intermittent asthma    PSHx: History reviewed. No pertinent surgical history. Medications: (Not in a hospital admission)  Allergies: has No Known Allergies. OBHx:  OB History  Gravida Para Term Preterm AB Living  3 2 2     2   SAB TAB Ectopic Multiple Live Births               # Outcome Date GA Lbr Len/2nd Weight Sex Delivery Anes PTL Lv  3 Current           2 Term 06/12/17 3855w0d  8 lb 9.6 oz (3.901 kg) M Vag-Spont     1 Term 01/23/15 155w0d  8 lb 8 oz (3.856 kg) M Vag-Spont      Family History  Problem Relation Age of Onset  . Asthma Mother   . Sarcoidosis Mother   . Diabetes Maternal Grandmother   . Hypertension Maternal Grandmother   . Asthma Maternal Grandfather   . Diabetes Paternal Grandmother   . Cancer Paternal Grandfather        prostate   Soc Hx: Never smoker, Alcohol: none and Recreational drug use: none  Objective:   Vitals:   04/15/19 1413  BP: 120/70   Constitutional: Well nourished, well developed female in no acute distress.  HEENT: normal Skin: Warm and dry.  Cardiovascular: Regular rate and rhythm.   Extremity: trace to 1+ bilateral pedal edema Respiratory: Clear to auscultation bilaterally. Normal respiratory effort Abdomen: gravid, non-tender Neuro: Cranial nerves grossly intact Psych: Alert and Oriented x3. No memory deficits. Normal mood and affect.  MS: normal gait, normal bilateral lower extremity ROM/strength/stability.  Pelvic exam: is not limited by body habitus External Genitalia, Bartholin's glands, Urethra, Skene's glands: within normal limits Vagina: within normal limits and with no blood in the vault Cervix: 1/30/-2  Reactive NST, baseline 135 bpm, moderate variability, accelerations: present, decelerations: one variable with good return to baseline, tocometry not done.   Perinatal info:  Blood type: A positive Rubella- Immune Varicella -Immune TDaP Given during third trimester of this pregnancy on 02/21/2019 RPR NR / HIV Neg/ HBsAg Neg   Assessment & Plan:   23 y.o. [id number] @ 264w5d, Admitted for a induction of labor.    Observe for cervical change, Epidural when ready, AROM when Appropriate and GBS status negative, treat as needed  Marcelyn BruinsJacelyn Makiyla Linch, CNM [facility],  [facility] 04/15/2019  2:37 PM

## 2019-04-15 NOTE — Progress Notes (Signed)
    Routine Prenatal Care Visit  Subjective  April Clay is a 23 y.o. G3P2002 at [redacted]w[redacted]d being seen today for ongoing prenatal care.  She is currently monitored for the following issues for this low-risk pregnancy and has Allergic rhinitis; Mild intermittent asthma; Anemia; ADHD; Supervision of other normal pregnancy, antepartum; Limited prenatal care, antepartum; and Iron deficiency anemia on their problem list.  ----------------------------------------------------------------------------------- Patient reports no complaints.   Contractions: Irregular. Vag. Bleeding: None.  Movement: Present. No leaking of fluid.  ----------------------------------------------------------------------------------- The following portions of the patient's history were reviewed and updated as appropriate: allergies, current medications, past family history, past medical history, past social history, past surgical history and problem list. Problem list updated.   Objective  Blood pressure 120/70, weight 195 lb (88.5 kg), last menstrual period 06/24/2018. Pregravid weight 155 lb (70.3 kg) Total Weight Gain 40 lb (18.1 kg)  Fetal Status: Fetal Heart Rate (bpm): 145   Movement: Present  Presentation: Vertex  General:  Alert, oriented and cooperative. Patient is in no acute distress.  Skin: Skin is warm and dry. No rash noted.   Cardiovascular: Normal heart rate noted  Respiratory: Normal respiratory effort, no problems with respiration noted  Abdomen: Soft, gravid, appropriate for gestational age. Pain/Pressure: Present     Pelvic:  Cervical exam performed Dilation: 1 Effacement (%): 30 Station: -2  Extremities: Normal range of motion.  Edema: None  Mental Status: Normal mood and affect. Normal behavior. Normal judgment and thought content.   NST Baseline: 145 bpm Variability: moderate Accelerations: present Decelerations: present, variable x 1 with good return to baseline Tocometry: not done The patient was  monitored for 20 minutes, fetal heart rate tracing was deemed reactive.  Assessment   23 y.o. [id number] at [redacted]w[redacted]d EDD 04/10/2019, by Ultrasound presenting for a routine prenatal visit.  Plan   pregnancy3 Problems (from 06/24/18 to present)    Problem Noted Resolved   Supervision of other normal pregnancy, antepartum 10/20/2018 by Rexene Agent, CNM No   Overview Addendum 02/21/2019 12:06 PM by Rexene Agent, [facility] Prenatal Labs  Dating Ultrasound 15 w Blood type: A/Positive/-- (02/19 1502)   Genetic Screen Declined Antibody:Negative (02/19 1502)  Anatomic [place] Complete 6/29 Rubella: 14.20 (02/19 1502) Varicella: Immune  GTT Early: N/A             Third trimester: 74 RPR: Non Reactive (02/19 1502)   Rhogam N/A HBsAg: Negative (02/19 1502)   TDaP vaccine  02/21/2019                     Flu Shot: HIV: Non Reactive (02/19 1502)   Baby Food Breast                               GBS:   Contraception  Pap: 10/13/2018, NILM  CBB     CS/VBAC    Support Person                  Term labor symptoms and general obstetric precautions including but not limited to vaginal bleeding, contractions, leaking of fluid and fetal movement were reviewed.  Please refer to After Visit Summary for other counseling recommendations.   Induction of labor scheduled 8/22 at 0500.  Avel Sensor, CNM 04/15/2019  2:54 PM

## 2019-04-16 ENCOUNTER — Inpatient Hospital Stay
Admission: EM | Admit: 2019-04-16 | Discharge: 2019-04-18 | DRG: 806 | Disposition: A | Discharge status: Home / Self Care | Payer: Medicaid Other | Attending: Obstetrics and Gynecology | Admitting: Obstetrics and Gynecology

## 2019-04-16 ENCOUNTER — Other Ambulatory Visit: Payer: Self-pay

## 2019-04-16 DIAGNOSIS — F909 Attention-deficit hyperactivity disorder, unspecified type: Secondary | ICD-10-CM | POA: Diagnosis present

## 2019-04-16 DIAGNOSIS — O480 Post-term pregnancy: Secondary | ICD-10-CM | POA: Diagnosis present

## 2019-04-16 DIAGNOSIS — Z3A40 40 weeks gestation of pregnancy: Secondary | ICD-10-CM | POA: Diagnosis not present

## 2019-04-16 DIAGNOSIS — Z3A41 41 weeks gestation of pregnancy: Secondary | ICD-10-CM

## 2019-04-16 DIAGNOSIS — D62 Acute posthemorrhagic anemia: Secondary | ICD-10-CM | POA: Diagnosis not present

## 2019-04-16 DIAGNOSIS — Z3480 Encounter for supervision of other normal pregnancy, unspecified trimester: Secondary | ICD-10-CM

## 2019-04-16 DIAGNOSIS — O9952 Diseases of the respiratory system complicating childbirth: Secondary | ICD-10-CM | POA: Diagnosis present

## 2019-04-16 DIAGNOSIS — O99344 Other mental disorders complicating childbirth: Secondary | ICD-10-CM | POA: Diagnosis present

## 2019-04-16 DIAGNOSIS — Z3490 Encounter for supervision of normal pregnancy, unspecified, unspecified trimester: Secondary | ICD-10-CM | POA: Diagnosis present

## 2019-04-16 DIAGNOSIS — O9081 Anemia of the puerperium: Secondary | ICD-10-CM | POA: Diagnosis not present

## 2019-04-16 DIAGNOSIS — O9902 Anemia complicating childbirth: Secondary | ICD-10-CM | POA: Diagnosis not present

## 2019-04-16 DIAGNOSIS — J45909 Unspecified asthma, uncomplicated: Secondary | ICD-10-CM | POA: Diagnosis present

## 2019-04-16 LAB — CBC
HCT: 32.1 % — ABNORMAL LOW (ref 36.0–46.0)
Hemoglobin: 10.1 g/dL — ABNORMAL LOW (ref 12.0–15.0)
MCH: 23.4 pg — ABNORMAL LOW (ref 26.0–34.0)
MCHC: 31.5 g/dL (ref 30.0–36.0)
MCV: 74.3 fL — ABNORMAL LOW (ref 80.0–100.0)
Platelets: 184 10*3/uL (ref 150–400)
RBC: 4.32 MIL/uL (ref 3.87–5.11)
RDW: 16.8 % — ABNORMAL HIGH (ref 11.5–15.5)
WBC: 8.8 10*3/uL (ref 4.0–10.5)
nRBC: 0 % (ref 0.0–0.2)

## 2019-04-16 LAB — TYPE AND SCREEN
ABO/RH(D): A POS
Antibody Screen: NEGATIVE

## 2019-04-16 MED ORDER — MISOPROSTOL 25 MCG QUARTER TABLET: 25.0000 ug | ORAL_TABLET | ORAL | Status: DC | PRN

## 2019-04-16 MED ORDER — FENTANYL CITRATE (PF) 100 MCG/2ML IJ SOLN: 50.0000 ug | INTRAMUSCULAR | Status: DC | PRN

## 2019-04-16 MED ORDER — LACTATED RINGERS IV SOLN: 500.0000 mL | INTRAVENOUS | Status: DC | PRN

## 2019-04-16 MED ORDER — MISOPROSTOL 25 MCG QUARTER TABLET
25.0000 ug | ORAL_TABLET | Freq: Once | ORAL | Status: AC
  Administered 2019-04-16: 06:00:00 25 ug via ORAL

## 2019-04-16 MED ORDER — MISOPROSTOL 200 MCG PO TABS
ORAL_TABLET | ORAL | Status: AC
  Administered 2019-04-16: 25 ug via VAGINAL
  Filled 2019-04-16: qty 4

## 2019-04-16 MED ORDER — BUTORPHANOL TARTRATE 1 MG/ML IJ SOLN
1.0000 mg | INTRAMUSCULAR | Status: DC | PRN
  Administered 2019-04-17: 1 mg via INTRAVENOUS
  Filled 2019-04-16: qty 1

## 2019-04-16 MED ORDER — LIDOCAINE HCL (PF) 1 % IJ SOLN: 30.0000 mL | INTRAMUSCULAR | Status: DC | PRN

## 2019-04-16 MED ORDER — AMMONIA AROMATIC IN INHA: 0.3000 mL | Freq: Once | RESPIRATORY_TRACT | Status: DC | PRN

## 2019-04-16 MED ORDER — TERBUTALINE SULFATE 1 MG/ML IJ SOLN: 0.2500 mg | Freq: Once | INTRAMUSCULAR | Status: DC | PRN

## 2019-04-16 MED ORDER — LIDOCAINE HCL (PF) 1 % IJ SOLN
INTRAMUSCULAR | Status: AC
  Filled 2019-04-16: qty 30

## 2019-04-16 MED ORDER — MISOPROSTOL 25 MCG QUARTER TABLET
25.0000 ug | ORAL_TABLET | Freq: Once | ORAL | Status: AC
  Administered 2019-04-16: 25 ug via BUCCAL
  Filled 2019-04-16: qty 1

## 2019-04-16 MED ORDER — OXYTOCIN BOLUS FROM INFUSION
500.0000 mL | Freq: Once | INTRAVENOUS | Status: DC
  Administered 2019-04-17: 500 mL via INTRAVENOUS

## 2019-04-16 MED ORDER — BUTORPHANOL TARTRATE 2 MG/ML IJ SOLN
INTRAMUSCULAR | Status: AC
  Administered 2019-04-16: 2 mg
  Filled 2019-04-16: qty 1

## 2019-04-16 MED ORDER — MISOPROSTOL 200 MCG PO TABS: 800.0000 ug | ORAL_TABLET | Freq: Once | ORAL | Status: DC | PRN

## 2019-04-16 MED ORDER — ACETAMINOPHEN 325 MG PO TABS: 650.0000 mg | ORAL_TABLET | ORAL | Status: DC | PRN

## 2019-04-16 MED ORDER — OXYTOCIN 40 UNITS IN NORMAL SALINE INFUSION - SIMPLE MED: 2.5000 [IU]/h | INTRAVENOUS | Status: DC

## 2019-04-16 MED ORDER — OXYTOCIN 10 UNIT/ML IJ SOLN
INTRAMUSCULAR | Status: AC
  Filled 2019-04-16: qty 2

## 2019-04-16 MED ORDER — MISOPROSTOL 25 MCG QUARTER TABLET
ORAL_TABLET | ORAL | Status: AC
  Administered 2019-04-16: 06:00:00 25 ug via ORAL
  Filled 2019-04-16: qty 1

## 2019-04-16 MED ORDER — OXYTOCIN 40 UNITS IN NORMAL SALINE INFUSION - SIMPLE MED
1.0000 m[IU]/min | INTRAVENOUS | Status: DC
  Administered 2019-04-16: 1 m[IU]/min via INTRAVENOUS
  Filled 2019-04-16: qty 1000

## 2019-04-16 MED ORDER — LACTATED RINGERS IV SOLN
INTRAVENOUS | Status: DC
  Administered 2019-04-16 – 2019-04-17 (×4): via INTRAVENOUS

## 2019-04-16 MED ORDER — AMMONIA AROMATIC IN INHA
RESPIRATORY_TRACT | Status: AC
  Filled 2019-04-16: qty 10

## 2019-04-16 MED ORDER — ONDANSETRON HCL 4 MG/2ML IJ SOLN: 4.0000 mg | Freq: Four times a day (QID) | INTRAMUSCULAR | Status: DC | PRN

## 2019-04-16 MED ORDER — MISOPROSTOL 50MCG HALF TABLET: 50.0000 ug | ORAL_TABLET | ORAL | Status: DC

## 2019-04-16 MED ORDER — MISOPROSTOL 25 MCG QUARTER TABLET
25.0000 ug | ORAL_TABLET | ORAL | Status: DC | PRN
  Administered 2019-04-16 (×2): 25 ug via VAGINAL
  Filled 2019-04-16 (×2): qty 1

## 2019-04-16 NOTE — Progress Notes (Signed)
  Labor Progress Note   23 y.o. [id number] @ [redacted]w[redacted]d , admitted for  Pregnancy, Labor Management.   Subjective:  Beginning to feel contractions. Still coping well.  Objective:  BP 128/79   Pulse 79   Temp 98.2 F (36.8 C) (Oral)   Resp 18   Ht 5\' 8"  (1.727 m)   Wt 88.5 kg   LMP 06/24/2018 Comment: neg preg test 06/22/18  BMI 29.65 kg/m  Abd: gravid, ND, FHT present, mild tenderness on exam Extr: no edema SVE: CERVIX: 2.5 cm dilated, 60 effaced, -2 station AROM clear  EFM: FHR: 150 bpm, variability: moderate,  accelerations:  Present,  decelerations:  Absent Toco: Frequency: Every 2 minutes Labs: I have reviewed the patient's lab results.   Assessment & Plan:  D1V6160 @ [redacted]w[redacted]d, admitted for  Pregnancy and Labor/Delivery Management  1. Pain management: ambulating/position changes. 2. FWB: FHT category Category 1.  3. ID: GBS negative 4. Labor management: s/p 2 doses cytotec: 25 mcg cervical and 25 mcg buccal, s/p AROM, start pitocin as needed  All discussed with patient, see orders   Rod Can, [facility] 04/16/2019  4:19 PM

## 2019-04-16 NOTE — Progress Notes (Signed)
Date of Initial H&P: 8/21/20202  History reviewed, patient examined, no change in status, ready for IOL   April Clay is a 23 year old G3 P2002 with EDC=04/10/2019 by a 15 week ultrasound who presents for induction of labor for postdates. Her pregnancy has been complicated by iron deficiency anemia. She has received 2 IV iron infusions this pregnancy. Last hemoglobin was 8.9 gm/dl on 02/14/2019. She is feeling some mild contractions, but no vaginal bleeding or leakage of fluid.  Her pregnancy has also been remarkable for mild asthma, and ADHD and the following: [facility] Prenatal Labs  Dating Ultrasound 15 w Blood type: A/Positive/-- (02/19 1502)   Genetic Screen Declined Antibody:Negative (02/19 1502)  Anatomic [place] Complete 6/29 Rubella: 14.20 (02/19 1502) Varicella: Immune  GTT Early: N/A             Third trimester: 75 RPR: Non Reactive (02/19 1502)   Rhogam N/A HBsAg: Negative (02/19 1502)   TDaP vaccine  02/21/2019                     Flu Shot: HIV: Non Reactive (02/19 1502)   Baby Food Breast                               GBS: negative  Contraception Pills Pap: 10/13/2018, NILM  CBB     CS/VBAC    Support Person         She has gained 40# with the pregnancy. Pelvis is proven to 3901 grams.  OB History  Gravida Para Term Preterm AB Living  3 2 2     2   SAB TAB Ectopic Multiple Live Births               # Outcome Date GA Lbr Len/2nd Weight Sex Delivery Anes PTL Lv  3 Current           2 Term 06/12/17 [redacted]w[redacted]d  3901 g M Vag-Spont     1 Term 01/23/15 [redacted]w[redacted]d  3856 g M Vag-Spont        Exam: General: appears comfortable, in NAD Vital signs: BP 135/84 (BP Location: Left Arm)   Pulse 85   Temp 98.6 F (37 C) (Oral)   Resp 16   Ht 5\' 8"  (1.727 m)   Wt 88.5 kg   LMP 06/24/2018 Comment: neg preg test 06/22/18  BMI 29.65 kg/m    Abdomen: cephalic presentation on ultrasound. Baby is  LOT FHR: 130 baseline with accelerations to 150, moderate variability Toco: contractions  every 3-4 minutes apart, mild  Cervix: FT/30%/-2  A: IUP at 40wk6d for IOL Bishop score: 3 FWB: Cat 1 GBS negative  P: Plan induction with  CYtotec 25 mcg buccally and vaginally then 25 mcg vaginally. Reviewed methods used for induction including Cytotec, foley bulb, Pitocin, AROM. Reviewed risks of hyperstimulation, fetal intolerance, Cesarean section and patient wishes to proceed. Cytotec placed GBS negative A POS/RI/VI TDAP UTD Breast/pills  Dalia Heading, CNM

## 2019-04-17 ENCOUNTER — Inpatient Hospital Stay: Payer: Medicaid Other | Admitting: Anesthesiology

## 2019-04-17 ENCOUNTER — Encounter: Payer: Self-pay | Admitting: Anesthesiology

## 2019-04-17 DIAGNOSIS — O9902 Anemia complicating childbirth: Secondary | ICD-10-CM

## 2019-04-17 LAB — CBC
HCT: 30 % — ABNORMAL LOW (ref 36.0–46.0)
Hemoglobin: 9.4 g/dL — ABNORMAL LOW (ref 12.0–15.0)
MCH: 23.4 pg — ABNORMAL LOW (ref 26.0–34.0)
MCHC: 31.3 g/dL (ref 30.0–36.0)
MCV: 74.6 fL — ABNORMAL LOW (ref 80.0–100.0)
Platelets: 186 10*3/uL (ref 150–400)
RBC: 4.02 MIL/uL (ref 3.87–5.11)
RDW: 16.8 % — ABNORMAL HIGH (ref 11.5–15.5)
WBC: 11.5 10*3/uL — ABNORMAL HIGH (ref 4.0–10.5)
nRBC: 0 % (ref 0.0–0.2)

## 2019-04-17 MED ORDER — PHENYLEPHRINE 40 MCG/ML (10ML) SYRINGE FOR IV PUSH (FOR BLOOD PRESSURE SUPPORT): 80.0000 ug | PREFILLED_SYRINGE | INTRAVENOUS | Status: DC | PRN

## 2019-04-17 MED ORDER — BUPIVACAINE HCL (PF) 0.25 % IJ SOLN
INTRAMUSCULAR | Status: DC | PRN
  Administered 2019-04-17: 10 mL via EPIDURAL

## 2019-04-17 MED ORDER — EPHEDRINE 5 MG/ML INJ: 10.0000 mg | INTRAVENOUS | Status: DC | PRN

## 2019-04-17 MED ORDER — FENTANYL 2.5 MCG/ML W/ROPIVACAINE 0.15% IN NS 100 ML EPIDURAL (ARMC)
12.0000 mL/h | EPIDURAL | Status: DC
  Administered 2019-04-17: 12 mL/h via EPIDURAL

## 2019-04-17 MED ORDER — LIDOCAINE-EPINEPHRINE (PF) 1.5 %-1:200000 IJ SOLN
INTRAMUSCULAR | Status: DC | PRN
  Administered 2019-04-17: 3 mL via PERINEURAL

## 2019-04-17 MED ORDER — IBUPROFEN 600 MG PO TABS
600.0000 mg | ORAL_TABLET | Freq: Four times a day (QID) | ORAL | Status: DC
  Administered 2019-04-17 – 2019-04-18 (×4): 600 mg via ORAL
  Filled 2019-04-17 (×4): qty 1

## 2019-04-17 MED ORDER — DIBUCAINE (PERIANAL) 1 % EX OINT: 1.0000 "application " | TOPICAL_OINTMENT | CUTANEOUS | Status: DC | PRN

## 2019-04-17 MED ORDER — SIMETHICONE 80 MG PO CHEW: 80.0000 mg | CHEWABLE_TABLET | ORAL | Status: DC | PRN

## 2019-04-17 MED ORDER — DIPHENHYDRAMINE HCL 50 MG/ML IJ SOLN: 12.5000 mg | INTRAMUSCULAR | Status: DC | PRN

## 2019-04-17 MED ORDER — ACETAMINOPHEN 325 MG PO TABS
650.0000 mg | ORAL_TABLET | ORAL | Status: DC | PRN
  Administered 2019-04-17: 650 mg via ORAL
  Filled 2019-04-17: qty 2

## 2019-04-17 MED ORDER — SENNOSIDES-DOCUSATE SODIUM 8.6-50 MG PO TABS
2.0000 | ORAL_TABLET | ORAL | Status: DC
  Administered 2019-04-18: 2 via ORAL
  Filled 2019-04-17: qty 2

## 2019-04-17 MED ORDER — ONDANSETRON HCL 4 MG PO TABS: 4.0000 mg | ORAL_TABLET | ORAL | Status: DC | PRN

## 2019-04-17 MED ORDER — WITCH HAZEL-GLYCERIN EX PADS: 1.0000 "application " | MEDICATED_PAD | CUTANEOUS | Status: DC | PRN

## 2019-04-17 MED ORDER — PRENATAL MULTIVITAMIN CH
1.0000 | ORAL_TABLET | Freq: Every day | ORAL | Status: DC
  Administered 2019-04-17 – 2019-04-18 (×2): 1 via ORAL
  Filled 2019-04-17 (×2): qty 1

## 2019-04-17 MED ORDER — LACTATED RINGERS IV SOLN
500.0000 mL | Freq: Once | INTRAVENOUS | Status: AC
  Administered 2019-04-17: 500 mL via INTRAVENOUS

## 2019-04-17 MED ORDER — BENZOCAINE-MENTHOL 20-0.5 % EX AERO: 1.0000 "application " | INHALATION_SPRAY | CUTANEOUS | Status: DC | PRN

## 2019-04-17 MED ORDER — DIPHENHYDRAMINE HCL 25 MG PO CAPS: 25.0000 mg | ORAL_CAPSULE | Freq: Four times a day (QID) | ORAL | Status: DC | PRN

## 2019-04-17 MED ORDER — ONDANSETRON HCL 4 MG/2ML IJ SOLN: 4.0000 mg | INTRAMUSCULAR | Status: DC | PRN

## 2019-04-17 MED ORDER — LIDOCAINE HCL (PF) 1 % IJ SOLN
INTRAMUSCULAR | Status: DC | PRN
  Administered 2019-04-17: 3 mL

## 2019-04-17 MED ORDER — FENTANYL 2.5 MCG/ML W/ROPIVACAINE 0.15% IN NS 100 ML EPIDURAL (ARMC)
EPIDURAL | Status: AC
  Filled 2019-04-17: qty 100

## 2019-04-17 MED ORDER — COCONUT OIL OIL: 1.0000 "application " | TOPICAL_OIL | Status: DC | PRN

## 2019-04-17 NOTE — Progress Notes (Signed)
   Subjective:  Patient is doing well/sleepy postpartum day 0, 7 hours. She is tolerating regular diet and her pain is controlled with PO medication. She is ambulating and voiding without difficulty. She reports breastfeeding is going well. She is giving previously pumped colostrum and putting baby to breast.  Objective:  Vital signs in last 24 hours: Temp:  [98.1 F (36.7 C)-99.9 F (37.7 C)] 98.8 F (37.1 C) (08/23 0906) Pulse Rate:  [71-147] 72 (08/23 0906) Resp:  [17-20] 18 (08/23 0906) BP: (118-148)/(61-103) 132/81 (08/23 0906) SpO2:  [97 %-100 %] 98 % (08/23 0906)    General: NAD Pulmonary: no increased work of breathing Abdomen: non-distended, non-tender, fundus firm at level of umbilicus Extremities: no edema, no erythema, no tenderness  Results for orders placed or performed during the hospital encounter of 04/16/19 (from the past 72 hour(s))  CBC     Status: Abnormal   Collection Time: 04/16/19  5:44 AM  Result Value Ref Range   WBC 8.8 4.0 - 10.5 K/uL   RBC 4.32 3.87 - 5.11 MIL/uL   Hemoglobin 10.1 (L) 12.0 - 15.0 g/dL   HCT 32.1 (L) 36.0 - 46.0 %   MCV 74.3 (L) 80.0 - 100.0 fL   MCH 23.4 (L) 26.0 - 34.0 pg   MCHC 31.5 30.0 - 36.0 g/dL   RDW 16.8 (H) 11.5 - 15.5 %   Platelets 184 150 - 400 K/uL   nRBC 0.0 0.0 - 0.2 %    Comment: Performed at [facility], [address] [place], [place] [address]  Type and screen     Status: None   Collection Time: 04/16/19  5:44 AM  Result Value Ref Range   ABO/RH(D) A POS    Antibody Screen NEG    Sample Expiration      04/19/2019,2359 Performed at [facility], [address], [place], [place] [address]     Assessment:   23 y.o. [phone] postpartum day # 0  Plan:    1) Acute blood loss anemia - hemodynamically stable and asymptomatic - po ferrous sulfate  2) A positive, Rubella Immune, Varicella Immune  3) TDAP status given antepartum  4) Feeding plan breast  5)  Education given regarding  options for contraception, as well as compatibility with breast feeding if applicable.  Patient plans on oral progesterone-only contraceptive for contraception.  6) Disposition: continue routine postpartum care  Rod Can, [facility] 04/17/2019, 12:04 PM

## 2019-04-17 NOTE — Discharge Summary (Signed)
OB Discharge Summary     Patient Name: April Clay DOB: [birth year] MRN: [id number]  Date of admission: 04/16/2019 Delivering provider: Rod Can, CNM  Date of Delivery: 04/17/2019  Date of discharge: 04/18/2019  Admitting diagnosis: postdates induction of labor Intrauterine pregnancy: [redacted]w[redacted]d     Secondary diagnosis: Anemia     Discharge diagnosis: Term Pregnancy Delivered                                                                                                Post partum procedures: None  Augmentation: AROM, Pitocin and Cytotec  Complications: None  Hospital course:  Induction of Labor With Vaginal Delivery   23 y.o. yo G3P2002 at [redacted]w[redacted]d was admitted to the hospital 04/16/2019 for induction of labor.  Indication for induction: Postdates.  Patient had an uncomplicated labor course as follows: Membrane Rupture Time/Date: 3:58 PM ,04/16/2019   Patient had delivery of viable female 5:06 AM, 04/17/2019  Details of delivery can be found in separate delivery note.   Patient had a routine postpartum course.  Patient is discharged home 04/18/19.  Physical exam  Vitals:   04/17/19 1712 04/17/19 1937 04/17/19 2339 04/18/19 0803  BP: 119/77 116/65 124/66 109/61  Pulse: 67 64 (!) 59 65  Resp: 18 20 20 20   Temp: 98 F (36.7 C) 98.2 F (36.8 C) 98 F (36.7 C) 98.5 F (36.9 C)  TempSrc: Oral Oral Oral Oral  SpO2:  100% 100% 97%  Weight:      Height:       General: cooperative and no distress Lochia: appropriate Uterine Fundus: firm Incision: N/A DVT Evaluation: No evidence of DVT  Labs: Lab Results  Component Value Date   WBC 11.5 (H) 04/17/2019   HGB 9.4 (L) 04/17/2019   HCT 30.0 (L) 04/17/2019   MCV 74.6 (L) 04/17/2019   PLT 186 04/17/2019    Discharge instruction: per After Visit Summary.  Medications:  Allergies as of 04/18/2019   No Known Allergies     Medication List    STOP taking these medications   Butalbital-APAP-Caffeine 50-325-40 MG capsule      TAKE these medications   albuterol 108 (90 Base) MCG/ACT inhaler Commonly known as: VENTOLIN HFA Inhale 1-2 puffs into the lungs every 6 (six) hours as needed for wheezing or shortness of breath.   norethindrone 0.35 MG tablet Commonly known as: MICRONOR Take 1 tablet (0.35 mg total) by mouth daily.   Vitafol Gummies 3.33-0.333-34.8 MG Chew Chew 1 tablet by mouth 2 (two) times daily.       Diet: routine diet  Activity: Advance as tolerated. Pelvic rest for 6 weeks.   Outpatient follow up: Follow-up Information    Rod Can, CNM. Schedule an appointment as soon as possible for a visit in 6 week(s).   Specialty: Obstetrics Why: postpartum follow up visit Contact information: [address] [place] [address] [phone]             Postpartum contraception: Progesterone only pills Rhogam Given postpartum: NA Rubella vaccine given postpartum: Rubella Immune Varicella vaccine given postpartum: Varicella Immune TDaP given antepartum or  postpartum: given antepartum  Newborn Data: Live born female Tyelyn Birth Weight: 7 lb 15 oz (3600 g) APGAR: 8, 9  Newborn Delivery   Birth date/time: 04/17/2019 05:06:00 Delivery type: Vaginal, Spontaneous       Baby Feeding: Breast  Disposition:home with mother  SIGNED:  Oswaldo ConroyJacelyn Y Schmid, CNM 04/18/2019 9:30 AM

## 2019-04-17 NOTE — Anesthesia Preprocedure Evaluation (Signed)
Anesthesia Evaluation  Patient identified by MRN, date of birth, ID band Patient awake    Reviewed: Allergy & Precautions, NPO status , Patient's Chart, lab work & pertinent test results  Airway Mallampati: II  TM Distance: >3 FB     Dental   Pulmonary asthma ,    Pulmonary exam normal        Cardiovascular negative cardio ROS Normal cardiovascular exam     Neuro/Psych PSYCHIATRIC DISORDERS negative neurological ROS     GI/Hepatic negative GI ROS, Neg liver ROS,   Endo/Other  negative endocrine ROS  Renal/GU negative Renal ROS  negative genitourinary   Musculoskeletal   Abdominal Normal abdominal exam  (+)   Peds negative pediatric ROS (+)  Hematology  (+) anemia ,   Anesthesia Other Findings   Reproductive/Obstetrics (+) Pregnancy                             Anesthesia Physical Anesthesia Plan  ASA: II  Anesthesia Plan: Epidural   Post-op Pain Management:    Induction:   PONV Risk Score and Plan:   Airway Management Planned:   Additional Equipment:   Intra-op Plan:   Post-operative Plan:   Informed Consent: I have reviewed the patients History and Physical, chart, labs and discussed the procedure including the risks, benefits and alternatives for the proposed anesthesia with the patient or authorized representative who has indicated his/her understanding and acceptance.     Dental advisory given  Plan Discussed with: CRNA and Surgeon  Anesthesia Plan Comments:         Anesthesia Quick Evaluation

## 2019-04-17 NOTE — Anesthesia Postprocedure Evaluation (Signed)
Anesthesia Post Note  Patient: Youth worker  Procedure(s) Performed: AN AD Maynard  Patient location during evaluation: Mother Baby Anesthesia Type: Epidural Level of consciousness: awake and alert and oriented Pain management: pain level controlled Vital Signs Assessment: post-procedure vital signs reviewed and stable Respiratory status: spontaneous breathing Cardiovascular status: blood pressure returned to baseline Anesthetic complications: no     Last Vitals:  Vitals:   04/17/19 0808 04/17/19 0906  BP: 118/67 132/81  Pulse: 71 72  Resp: 18 18  Temp: 37.4 C 37.1 C  SpO2:  98%    Last Pain:  Vitals:   04/17/19 0906  TempSrc: Oral  PainSc:                  Rommie Dunn

## 2019-04-17 NOTE — Anesthesia Procedure Notes (Signed)
Epidural Patient location during procedure: OB Start time: 04/17/2019 3:14 AM End time: 04/17/2019 3:27 AM  Staffing Anesthesiologist: Alvin Critchley, MD Performed: anesthesiologist   Preanesthetic Checklist Completed: patient identified, site marked, surgical consent, pre-op evaluation, timeout performed, IV checked, risks and benefits discussed and monitors and equipment checked  Epidural Patient position: sitting Prep: Betadine Patient monitoring: heart rate, continuous pulse ox and blood pressure Approach: midline Location: L3-L4 Injection technique: LOR air  Needle:  Needle type: Tuohy  Needle gauge: 17 G Needle length: 9 cm and 9 Needle insertion depth: 6 cm Catheter type: closed end flexible Catheter size: 19 Gauge Test dose: negative and 1.5% lidocaine with Epi 1:200 K  Assessment Sensory level: T8 Events: blood not aspirated, injection not painful, no injection resistance, negative IV test and no paresthesia  Additional Notes Time out called.  Patient placed in sitting position.  Back prepped and draped in sterile fashion.  A skin wheal was made in the L3-L4 interspace with 1% Lidocaine plain.  A 17G Tuohy needle was advanced into the epidural space by a loss of resistance technique.  The epidural catheter was threaded 3 cm into the space and the TD was negative.  The patient tolerated the procedure well.  No blood, fluid or paresthesias.  The catheter was affixed to the back in sterile fashion.Reason for block:procedure for pain

## 2019-04-18 MED ORDER — NORETHINDRONE 0.35 MG PO TABS: 1.0000 | ORAL_TABLET | Freq: Every day | ORAL | 11 refills | Status: DC

## 2019-04-18 NOTE — Progress Notes (Signed)
Patient discharged home with infant. Discharge instructions and prescriptions given and reviewed with patient. Patient verbalized understanding. Escorted out by staff.  

## 2019-04-18 NOTE — Lactation Note (Signed)
This note was copied from a baby's chart. Lactation Consultation Note  Patient Name: April Clay [id number] Date: 04/18/2019 Reason for consult: Follow-up assessment  LC spoke with parents before discharge. Mom reports breast and bottle feeding to be going "ok". Some nipple pain/discomfort noted; reviewed with mom nipple care and comfort measures, as well ensuring infants position and latch at the breast. Reviewed normal course of lactation, breastfeeding on cue, newborn feeding patterns, cluster feeding/growth spurts. Encouraged to offer breast before formula supplement- reviewing breastfeeding and emptying breasts make more milk. Reviewed onset of breast fullness and potential for engorgement. Encouraged breast massage and compression while breastfeeding, hand expression pre/post feeds as needed, and use of own EBP if desired. Parents have no questions or concerns at this time. Provided information for breastfeeding support through outpatient lactation consultations, and virtual breastfeeding support group.   Maternal Data Formula Feeding for Exclusion: No Has patient been taught Hand Expression?: Yes Does the patient have breastfeeding experience prior to this delivery?: No  Feeding Feeding Type: Breast Fed  LATCH Score                   Interventions Interventions: Breast feeding basics reviewed  Lactation Tools Discussed/Used     Consult Status Consult Status: Complete Date: 04/18/19 Follow-up type: Call as needed    Lavonia Drafts 04/18/2019, 12:21 PM

## 2019-04-19 LAB — RPR: RPR Ser Ql: NONREACTIVE

## 2019-06-09 ENCOUNTER — Ambulatory Visit: Payer: Medicaid Other | Admitting: Maternal Newborn

## 2019-06-10 ENCOUNTER — Telehealth: Payer: Self-pay

## 2019-06-10 NOTE — Telephone Encounter (Signed)
Pt calling for refill of bcp.  [phone]  Adv pt she has refills at the pharm.  To call pharm.

## 2019-06-21 ENCOUNTER — Ambulatory Visit: Payer: Medicaid Other | Admitting: Maternal Newborn

## 2019-07-04 ENCOUNTER — Ambulatory Visit: Payer: Medicaid Other | Admitting: Maternal Newborn

## 2019-07-11 ENCOUNTER — Telehealth: Payer: Self-pay

## 2019-07-11 NOTE — Telephone Encounter (Signed)
Pt needs an appt for possible mastitis. Please help schedule.

## 2019-07-14 ENCOUNTER — Ambulatory Visit (INDEPENDENT_AMBULATORY_CARE_PROVIDER_SITE_OTHER): Payer: Medicaid Other | Admitting: Maternal Newborn

## 2019-07-14 ENCOUNTER — Other Ambulatory Visit: Payer: Self-pay

## 2019-07-14 ENCOUNTER — Encounter: Payer: Self-pay | Admitting: Maternal Newborn

## 2019-07-14 DIAGNOSIS — Z1389 Encounter for screening for other disorder: Secondary | ICD-10-CM | POA: Diagnosis not present

## 2019-07-14 NOTE — Progress Notes (Signed)
Postpartum Visit  Chief Complaint:  Chief Complaint  Patient presents with  . Postpartum Care    Stressing out about breast feeding     History of Present Illness: Patient is a 23 y.o. N5A2130 presenting for a postpartum visit.  Date of delivery: 04/17/2019 Type of delivery: Vaginal delivery - Vacuum or forceps assisted:  No Episiotomy: No Laceration: no  Pregnancy or labor problems:  no Any problems since the delivery:  no  Newborn Details:  SINGLETON Female  Birth weight: 7 lb, 15 oz  Maternal Details:  Breast Feeding:  yes Post partum depression/anxiety noted:  no Edinburgh Post-Partum Depression Score:  8  Date of last PAP: 10/13/2018, NILM  Review of Systems  Constitutional: Negative.   HENT: Negative.   Eyes: Negative.   Respiratory: Negative for shortness of breath and wheezing.   Cardiovascular: Negative for chest pain and palpitations.  Gastrointestinal: Negative.   Genitourinary: Negative.   Musculoskeletal: Negative.   Skin: Negative.   Neurological: Negative.   Endo/Heme/Allergies: Negative.   Psychiatric/Behavioral: Negative.    Felt like she had a plugged duct this morning on the right side, no redness or enlargement   Past Medical History:  Past Medical History:  Diagnosis Date  . ADHD   . Adjustment disorder with mixed disturbance of emotions and conduct    impulsively took pills in context of diagreement with significant other  . Allergic rhinitis   . Amblyopia   . Anemia   . Developmental delay    related to school  . Mild intermittent asthma     Past Surgical History:  Past Surgical History:  Procedure Laterality Date  . NO PAST SURGERIES      Family History:  Family History  Problem Relation Age of Onset  . Asthma Mother   . Sarcoidosis Mother   . Diabetes Maternal Grandmother   . Hypertension Maternal Grandmother   . Asthma Maternal Grandfather   . Diabetes Paternal Grandmother   . Cancer Paternal Grandfather    prostate    Social History:  Social History   Socioeconomic History  . Marital status: Single    Spouse name: Not on file  . Number of children: Not on file  . Years of education: Not on file  . Highest education level: Not on file  Occupational History  . Not on file  Social Needs  . Financial resource strain: Not on file  . Food insecurity    Worry: Not on file    Inability: Not on file  . Transportation needs    Medical: Not on file    Non-medical: Not on file  Tobacco Use  . Smoking status: Never Smoker  . Smokeless tobacco: Never Used  Substance and Sexual Activity  . Alcohol use: Not Currently    Frequency: Never  . Drug use: Never  . Sexual activity: Yes    Birth control/protection: Pill  Lifestyle  . Physical activity    Days per week: Not on file    Minutes per session: Not on file  . Stress: Not on file  Relationships  . Social Herbalist on phone: Not on file    Gets together: Not on file    Attends religious service: Not on file    Active member of club or organization: Not on file    Attends meetings of clubs or organizations: Not on file    Relationship status: Not on file  . Intimate partner violence  Fear of current or ex partner: Not on file    Emotionally abused: Not on file    Physically abused: Not on file    Forced sexual activity: Not on file  Other Topics Concern  . Not on file  Social History Narrative  . Not on file    Allergies:  No Known Allergies  Medications: Prior to Admission medications   Medication Sig Start Date End Date Taking? Authorizing Provider  norethindrone (MICRONOR) 0.35 MG tablet Take 1 tablet (0.35 mg total) by mouth daily. 04/18/19  Yes Oswaldo Conroy, CNM  Prenatal Vit-Fe Phos-FA-Omega (VITAFOL GUMMIES) 3.33-0.333-34.8 MG CHEW Chew 1 tablet by mouth 2 (two) times daily. 10/13/18  Yes Nadara Mustard, MD  albuterol (PROVENTIL HFA;VENTOLIN HFA) 108 (90 Base) MCG/ACT inhaler Inhale 1-2 puffs into  the lungs every 6 (six) hours as needed for wheezing or shortness of breath. Patient not taking: Reported on 04/16/2019 11/18/18   Tresea Mall, CNM    Physical Exam Vitals:  Vitals:   07/14/19 1339  BP: 110/70    General: NAD HEENT: normocephalic, anicteric Cardiovascular: RRR, no murmurs, rubs, or gallops Pulmonary: No increased work of breathing Breasts: right breast normal without mass, skin or nipple changes or axillary nodes, left breast normal without mass, skin or nipple changes or axillary nodes, lactating, no erythema or tenderness, nipples normal. Abdomen: Soft, non-tender, non-distended.  Umbilicus without lesions.  No hepatomegaly, splenomegaly or masses palpable. No evidence of hernia. Genitourinary:  External: Normal external female genitalia.  Normal urethral  meatus, normal Bartholin's and Skene's glands.    Vagina: Normal vaginal mucosa, no evidence of prolapse.    Cervix: Speculum exam deferred, no symptoms  Uterus: Non-enlarged, mobile, normal contour.  No CMT  Adnexa: ovaries non-enlarged, no adnexal masses  Rectal: deferred Extremities: no edema, erythema, or tenderness Neurologic: Grossly intact Psychiatric: mood appropriate, affect full  Assessment: 23 y.o. D1V6160 presenting for a 6 week postpartum visit  Plan: Problem List Items Addressed This Visit      Other   Postpartum care following vaginal delivery - Primary      1) Contraception: Currently using progesterone only pills, plans to change to combined OCP when she is done breastfeeding.  2)  Pap is up to date, last one was normal in 2020.  3) Patient underwent screening for postpartum depression with no concerns noted. She has felt stressed about breastfeeding, but this has improved over the past weeks and she is feeling better now that she has a routine that is working for her.  4) Discussed mastitis signs/symptoms. No evidence of infection on exam today.  5) Follow up 1 year for an annual  exam.  Marcelyn Bruins, CNM 07/14/2019  3:12 PM

## 2019-08-12 ENCOUNTER — Ambulatory Visit: Payer: Medicaid Other | Admitting: Advanced Practice Midwife

## 2019-12-28 ENCOUNTER — Other Ambulatory Visit: Payer: Self-pay | Admitting: Advanced Practice Midwife

## 2019-12-28 DIAGNOSIS — J45909 Unspecified asthma, uncomplicated: Secondary | ICD-10-CM

## 2019-12-28 DIAGNOSIS — O99519 Diseases of the respiratory system complicating pregnancy, unspecified trimester: Secondary | ICD-10-CM

## 2019-12-28 NOTE — Telephone Encounter (Signed)
Patient is calling to see if her prescription for albuterol (PROVENTIL HFA;VENTOLIN HFA) 108 (90 Base) MCG/ACT inhaler. Could be called in to [facility] on [address]. Please advise

## 2019-12-28 NOTE — Telephone Encounter (Signed)
advise

## 2020-01-11 ENCOUNTER — Ambulatory Visit (INDEPENDENT_AMBULATORY_CARE_PROVIDER_SITE_OTHER): Payer: Medicaid Other | Admitting: Advanced Practice Midwife

## 2020-01-11 ENCOUNTER — Other Ambulatory Visit: Payer: Self-pay

## 2020-01-11 ENCOUNTER — Encounter: Payer: Self-pay | Admitting: Advanced Practice Midwife

## 2020-01-11 VITALS — BP 119/76 | HR 82 | Ht 68.0 in | Wt 167.0 lb

## 2020-01-11 DIAGNOSIS — Z30011 Encounter for initial prescription of contraceptive pills: Secondary | ICD-10-CM

## 2020-01-11 MED ORDER — NORGESTIMATE-ETH ESTRADIOL 0.25-35 MG-MCG PO TABS: 1.0000 | ORAL_TABLET | Freq: Every day | ORAL | 4 refills | Status: AC

## 2020-01-11 NOTE — Progress Notes (Signed)
Patient ID: April Clay, female   DOB: 1996-03-28, 24 y.o.   MRN: [id number]  Reason for Consult: Advice Only (Discuss switching OCP's, irregular bleeding, currently breastfeeding)   Referred by April Clay, CNM  Subjective:  HPI:  April Clay is a 24 y.o. female being seen with complaint of irregular and unpredictable vaginal bleeding while on progesterone only birth control. She is 9 months postpartum and continues to breastfeed primarily at night. For her first 6 months postpartum she had regular monthly light bleeding. For the past 3 months the bleeding has been irregular, mostly light and is off and on. In April she had 2 periods with spotting off and on between the periods. She would like to switch to a "stronger" pill. We discussed adjustment time when switching hormones. She is a non-smoker. Her last PAP smear was 1 year ago and was normal. She has no other concerns today.   Past Medical History:  Diagnosis Date  . ADHD   . Adjustment disorder with mixed disturbance of emotions and conduct    impulsively took pills in context of diagreement with significant other  . Allergic rhinitis   . Amblyopia   . Anemia   . Developmental delay    related to school  . Mild intermittent asthma    Family History  Problem Relation Age of Onset  . Asthma Mother   . Sarcoidosis Mother   . Diabetes Maternal Grandmother   . Hypertension Maternal Grandmother   . Asthma Maternal Grandfather   . Diabetes Paternal Grandmother   . Cancer Paternal Grandfather        prostate   Past Surgical History:  Procedure Laterality Date  . NO PAST SURGERIES      Short Social History:  Social History   Tobacco Use  . Smoking status: Never Smoker  . Smokeless tobacco: Never Used  Substance Use Topics  . Alcohol use: Not Currently    No Known Allergies  Current Outpatient Medications  Medication Sig Dispense Refill  . albuterol (VENTOLIN HFA) 108 (90 Base) MCG/ACT inhaler INHALE 1 TO 2  PUFFS INTO THE LUNGS EVERY 6 HOURS AS NEEDED FOR WHEEZING OR SHORTNESS OF BREATH 8.5 g 2  . norethindrone (MICRONOR) 0.35 MG tablet Take 1 tablet (0.35 mg total) by mouth daily. 1 Package 11  . Prenatal Vit-Fe Phos-FA-Omega (VITAFOL GUMMIES) 3.33-0.333-34.8 MG CHEW Chew 1 tablet by mouth 2 (two) times daily. 60 tablet 8  . norgestimate-ethinyl estradiol (ORTHO-CYCLEN) 0.25-35 MG-MCG tablet Take 1 tablet by mouth daily. 3 Package 4   No current facility-administered medications for this visit.    Review of Systems  Constitutional: Negative for chills and fever.  HENT: Negative for congestion, ear discharge, ear pain, hearing loss, sinus pain and sore throat.   Eyes: Negative for blurred vision and double vision.  Respiratory: Negative for cough, shortness of breath and wheezing.   Cardiovascular: Negative for chest pain, palpitations and leg swelling.  Gastrointestinal: Negative for abdominal pain, blood in stool, constipation, diarrhea, heartburn, melena, nausea and vomiting.  Genitourinary: Negative for dysuria, flank pain, frequency, hematuria and urgency.       Positive for irregular bleeding  Musculoskeletal: Negative for back pain, joint pain and myalgias.  Skin: Negative for itching and rash.  Neurological: Negative for dizziness, tingling, tremors, sensory change, speech change, focal weakness, seizures, loss of consciousness, weakness and headaches.  Endo/Heme/Allergies: Negative for environmental allergies. Does not bruise/bleed easily.  Psychiatric/Behavioral: Negative for depression, hallucinations, memory loss, substance abuse and suicidal  ideas. The patient is not nervous/anxious and does not have insomnia.         Objective:  Objective   Vitals:   01/11/20 1519  BP: 119/76  Pulse: 82  Weight: 167 lb (75.8 kg)  Height: 5\' 8"  (1.727 m)   Body mass index is 25.39 kg/m. Constitutional: Well nourished, well developed female in no acute distress.  HEENT: normal Skin:  Warm and dry.  Respiratory:  Normal respiratory effort Neuro: DTRs 2+, Cranial nerves grossly intact Psych: Alert and Oriented x3. No memory deficits. Normal mood and affect.  MS: normal gait, normal bilateral lower extremity ROM/strength/stability.  The majority of the visit was spent in consultation and therefore physical exam was limited.  Assessment/Plan:     24 y.o. G3 P3003 female desiring switch from progesterone only pill to combined oral contraceptive.  Rx Ortho Cyclen Return to clinic in 1 year for annual and as needed   Rod Can CNM [facility]

## 2020-05-09 ENCOUNTER — Other Ambulatory Visit: Payer: Self-pay

## 2020-05-09 ENCOUNTER — Other Ambulatory Visit: Payer: Medicaid Other

## 2020-05-09 DIAGNOSIS — Z20822 Contact with and (suspected) exposure to covid-19: Secondary | ICD-10-CM

## 2020-05-10 LAB — NOVEL CORONAVIRUS, NAA: SARS-CoV-2, NAA: NOT DETECTED

## 2020-05-10 LAB — SARS-COV-2, NAA 2 DAY TAT

## 2020-12-07 ENCOUNTER — Other Ambulatory Visit: Payer: Self-pay | Admitting: Advanced Practice Midwife

## 2020-12-07 DIAGNOSIS — J45909 Unspecified asthma, uncomplicated: Secondary | ICD-10-CM

## 2021-01-16 ENCOUNTER — Other Ambulatory Visit: Payer: Self-pay | Admitting: Advanced Practice Midwife

## 2021-01-16 DIAGNOSIS — Z30011 Encounter for initial prescription of contraceptive pills: Secondary | ICD-10-CM

## 2021-03-04 ENCOUNTER — Other Ambulatory Visit: Payer: Self-pay | Admitting: Advanced Practice Midwife

## 2021-03-04 DIAGNOSIS — Z30011 Encounter for initial prescription of contraceptive pills: Secondary | ICD-10-CM

## 2021-12-09 ENCOUNTER — Encounter: Payer: Self-pay | Admitting: Emergency Medicine

## 2021-12-09 ENCOUNTER — Other Ambulatory Visit: Payer: Self-pay

## 2021-12-09 ENCOUNTER — Encounter: Payer: Self-pay | Admitting: Hematology and Oncology

## 2021-12-09 DIAGNOSIS — N390 Urinary tract infection, site not specified: Secondary | ICD-10-CM | POA: Diagnosis not present

## 2021-12-09 DIAGNOSIS — M545 Low back pain, unspecified: Secondary | ICD-10-CM | POA: Diagnosis present

## 2021-12-09 DIAGNOSIS — J4520 Mild intermittent asthma, uncomplicated: Secondary | ICD-10-CM | POA: Diagnosis not present

## 2021-12-09 LAB — URINALYSIS, COMPLETE (UACMP) WITH MICROSCOPIC
Bacteria, UA: NONE SEEN
Bilirubin Urine: NEGATIVE
Glucose, UA: NEGATIVE mg/dL
Ketones, ur: NEGATIVE mg/dL
Nitrite: NEGATIVE
Protein, ur: NEGATIVE mg/dL
Specific Gravity, Urine: 1.018 (ref 1.005–1.030)
pH: 6 (ref 5.0–8.0)

## 2021-12-09 LAB — POC URINE PREG, ED: Preg Test, Ur: NEGATIVE

## 2021-12-09 NOTE — ED Triage Notes (Signed)
Pt to ED via POV, pt states hx of kidney infections, reports intermittent fevers over the weekend, reports taking alkaseltzer cold and flu for intermittent fevers. Pt states same symptoms over the weekend for kidney infection, pt reports lower back pain, fever and HA at this time. Pt A-febrile on arrival to ED. Pt A&O x4, VSS on arrival.  ?

## 2021-12-10 ENCOUNTER — Emergency Department
Admission: EM | Admit: 2021-12-10 | Discharge: 2021-12-10 | Disposition: A | Discharge status: Home / Self Care | Payer: Medicaid Other | Attending: Emergency Medicine | Admitting: Emergency Medicine

## 2021-12-10 DIAGNOSIS — N390 Urinary tract infection, site not specified: Secondary | ICD-10-CM

## 2021-12-10 MED ORDER — CEPHALEXIN 500 MG PO CAPS
500.0000 mg | ORAL_CAPSULE | Freq: Two times a day (BID) | ORAL | 0 refills | Status: AC
Start: 2021-12-10 — End: 2021-12-17

## 2021-12-10 MED ORDER — CEPHALEXIN 500 MG PO CAPS
500.0000 mg | ORAL_CAPSULE | Freq: Once | ORAL | Status: AC
  Administered 2021-12-10: 500 mg via ORAL
  Filled 2021-12-10: qty 1

## 2021-12-10 NOTE — Discharge Instructions (Addendum)
You may alternate Tylenol 1000 mg every 6 hours as needed for pain, fever and Ibuprofen 800 mg every 6-8 hours as needed for pain, fever.  Please take Ibuprofen with food.  Do not take more than 4000 mg of Tylenol (acetaminophen) in a 24 hour period. ? ? ?I recommend increasing your water intake and you may take over-the-counter cranberry supplements and Azo to help with any discomfort with urination. ?

## 2021-12-10 NOTE — ED Provider Notes (Signed)
? ?[facility] ?Provider Note ? ? ? Event Date/Time  ? First MD Initiated Contact with Patient 12/10/21 0143   ?  (approximate) ? ? ?History  ? ?Back Pain ? ? ?HPI ? ?April Clay is a 26 y.o. female with history of asthma, ADHD, adjustment disorder who presents to the emergency department with complaints of lower back pain, fever, urinary frequency and urgency over the past few days.  States last fever was 48 hours ago.  No abdominal pain, vomiting, diarrhea, vaginal discharge.  States she is on her menstrual cycle.  States this is felt like previous urinary tract infection that she has had but she has not had any dysuria. ? ? ?History provided by patient. ? ? ? ?Past Medical History:  ?Diagnosis Date  ? ADHD   ? Adjustment disorder with mixed disturbance of emotions and conduct   ? impulsively took pills in context of diagreement with significant other  ? Allergic rhinitis   ? Amblyopia   ? Anemia   ? Developmental delay   ? related to school  ? Mild intermittent asthma   ? ? ?Past Surgical History:  ?Procedure Laterality Date  ? NO PAST SURGERIES    ? ? ?MEDICATIONS:  ?Prior to Admission medications   ?Medication Sig Start Date End Date Taking? Authorizing Provider  ?albuterol (VENTOLIN HFA) 108 (90 Base) MCG/ACT inhaler INHALE 1 TO 2 PUFFS INTO THE LUNGS EVERY 6 HOURS AS NEEDED FOR WHEEZING OR SHORTNESS OF BREATH 12/12/20   Tresea Mall, CNM  ?norgestimate-ethinyl estradiol (ORTHO-CYCLEN) 0.25-35 MG-MCG tablet Take 1 tablet by mouth daily. 01/11/20   Tresea Mall, CNM  ?Prenatal Vit-Fe Phos-FA-Omega (VITAFOL GUMMIES) 3.33-0.333-34.8 MG CHEW Chew 1 tablet by mouth 2 (two) times daily. 10/13/18   Nadara Mustard, MD  ? ? ?Physical Exam  ? ?Triage Vital Signs: ?ED Triage Vitals  ?Enc Vitals Group  ?   BP 12/09/21 2231 115/66  ?   Pulse Rate 12/09/21 2231 89  ?   Resp 12/09/21 2231 17  ?   Temp 12/09/21 2231 98.1 ?F (36.7 ?C)  ?   Temp Source 12/09/21 2231 Oral  ?   SpO2 12/09/21 2231 97 %  ?    Weight 12/09/21 2233 150 lb (68 kg)  ?   Height 12/09/21 2233 5\' 8"  (1.727 m)  ?   Head Circumference --   ?   Peak Flow --   ?   Pain Score 12/09/21 2233 4  ?   Pain Loc --   ?   Pain Edu? --   ?   Excl. in GC? --   ? ? ?Most recent vital signs: ?Vitals:  ? 12/09/21 2231  ?BP: 115/66  ?Pulse: 89  ?Resp: 17  ?Temp: 98.1 ?F (36.7 ?C)  ?SpO2: 97%  ? ? ?CONSTITUTIONAL: Alert and oriented and responds appropriately to questions. Well-appearing; well-nourished afebrile, nontoxic ?HEAD: Normocephalic, atraumatic ?EYES: Conjunctivae clear, pupils appear equal, sclera nonicteric ?ENT: normal nose; moist mucous membranes ?NECK: Supple, normal ROM ?CARD: RRR; S1 and S2 appreciated; no murmurs, no clicks, no rubs, no gallops ?RESP: Normal chest excursion without splinting or tachypnea; breath sounds clear and equal bilaterally; no wheezes, no rhonchi, no rales, no hypoxia or respiratory distress, speaking full sentences ?ABD/GI: Normal bowel sounds; non-distended; soft, non-tender, no rebound, no guarding, no peritoneal signs, no tenderness at McBurney's point ?BACK: The back appears normal, no CVA tenderness, no midline spinal tenderness or step-off or deformity, no redness or warmth, no soft  tissue swelling or ecchymosis ?EXT: Normal ROM in all joints; no deformity noted, no edema; no cyanosis ?SKIN: Normal color for age and race; warm; no rash on exposed skin ?NEURO: Moves all extremities equally, normal speech, normal gait ?PSYCH: The patient's mood and manner are appropriate. ? ? ?ED Results / Procedures / Treatments  ? ?LABS: ?(all labs ordered are listed, but only abnormal results are displayed) ?Labs Reviewed  ?URINALYSIS, COMPLETE (UACMP) WITH MICROSCOPIC - Abnormal; Notable for the following components:  ?    Result Value  ? Color, Urine YELLOW (*)   ? APPearance CLEAR (*)   ? Hgb urine dipstick SMALL (*)   ? Leukocytes,Ua MODERATE (*)   ? All other components within normal limits  ?URINE CULTURE  ?POC URINE PREG,  ED  ? ? ? ?EKG: ? ?RADIOLOGY: ?My personal review and interpretation of imaging:   ? ?I have personally reviewed all radiology reports.   ?No results found. ? ? ?PROCEDURES: ? ?Critical Care performed: No ? ? ? ? ?Procedures ? ? ? ?IMPRESSION / MDM / ASSESSMENT AND PLAN / ED COURSE  ?I reviewed the triage vital signs and the nursing notes. ? ? ? ?Patient here with symptoms of UTI.  States this feels similar to previous UTIs.  Afebrile and nontoxic-appearing here with benign abdominal exam. ? ? ? ? ?DIFFERENTIAL DIAGNOSIS (includes but not limited to):   UTI, pyelonephritis, kidney stone, musculoskeletal back pain, doubt appendicitis ? ? ?PLAN: We will obtain urinalysis, urine culture, urine pregnancy test.  Patient declines anything for pain at this time. ? ? ?MEDICATIONS GIVEN IN ED: ?Medications  ?cephALEXin (KEFLEX) capsule 500 mg (500 mg Oral Given 12/10/21 0221)  ? ? ? ?ED COURSE: Patient's urine shows moderate leukocyte esterase, 21-50 white blood cells.  No bacteria and rare squamous cells.  Culture is pending.  She states this feels similar to previous UTIs.  We will start her on Keflex.  She has tolerated Keflex before and it appears based on her previous admission in 2016 for pyelonephritis she had a culture positive for pansensitive E. coli. ? ? ?CONSULTS: Patient well-appearing, nontoxic, afebrile, hemodynamically stable with benign abdominal exam.  Does not require admission at this time.  No vomiting. ? ? ?OUTSIDE RECORDS REVIEWED: Reviewed patient's previous notes from [facility] in December 2015 and outpatient office visit and subsequent admission in January 2016 for pyelonephritis in the setting of pregnancy.  It appears on that admission she was started on Rocephin and transition to Keflex. ? ? ? ? ? ? ? ? ?FINAL CLINICAL IMPRESSION(S) / ED DIAGNOSES  ? ?Final diagnoses:  ?Acute UTI  ? ? ? ?Rx / DC Orders  ? ?ED Discharge Orders   ? ?      Ordered  ?  cephALEXin (KEFLEX) 500 MG capsule  2 times  daily       ? 12/10/21 0201  ? ?  ?  ? ?  ? ? ? ?Note:  This document was prepared using Dragon voice recognition software and may include unintentional dictation errors. ?  ?Kyng Matlock, Layla Maw, DO ?12/10/21 0228 ? ?

## 2021-12-12 LAB — URINE CULTURE: Culture: 20000 — AB

## 2022-06-15 ENCOUNTER — Other Ambulatory Visit: Payer: Self-pay

## 2022-06-15 ENCOUNTER — Encounter: Payer: Self-pay | Admitting: Hematology and Oncology

## 2022-06-15 ENCOUNTER — Emergency Department
Admission: EM | Admit: 2022-06-15 | Discharge: 2022-06-15 | Disposition: A | Discharge status: Home / Self Care | Payer: Medicaid Other | Attending: Emergency Medicine | Admitting: Emergency Medicine

## 2022-06-15 DIAGNOSIS — N946 Dysmenorrhea, unspecified: Secondary | ICD-10-CM

## 2022-06-15 DIAGNOSIS — R109 Unspecified abdominal pain: Secondary | ICD-10-CM | POA: Insufficient documentation

## 2022-06-15 DIAGNOSIS — J4520 Mild intermittent asthma, uncomplicated: Secondary | ICD-10-CM | POA: Diagnosis not present

## 2022-06-15 LAB — URINALYSIS, ROUTINE W REFLEX MICROSCOPIC
Bilirubin Urine: NEGATIVE
Glucose, UA: NEGATIVE mg/dL
Hgb urine dipstick: NEGATIVE
Ketones, ur: NEGATIVE mg/dL
Leukocytes,Ua: NEGATIVE
Nitrite: NEGATIVE
Protein, ur: NEGATIVE mg/dL
Specific Gravity, Urine: 1.009 (ref 1.005–1.030)
pH: 8 (ref 5.0–8.0)

## 2022-06-15 LAB — POC URINE PREG, ED: Preg Test, Ur: NEGATIVE

## 2022-06-15 MED ORDER — ACETAMINOPHEN 325 MG PO TABS
650.0000 mg | ORAL_TABLET | Freq: Once | ORAL | Status: AC
  Administered 2022-06-15: 650 mg via ORAL
  Filled 2022-06-15: qty 2

## 2022-06-15 NOTE — ED Triage Notes (Signed)
Pt presents via POV c/o abd cramping from "period cramps". Requesting doctor;s note.

## 2022-06-15 NOTE — Discharge Instructions (Signed)
You may continue to take Tylenol/ibuprofen per package instructions to help with your cramps.  Please return for any new, worsening, or change in symptoms or other concerns.

## 2022-06-15 NOTE — ED Provider Notes (Signed)
[facility] Provider Note    Event Date/Time   First MD Initiated Contact with Patient 06/15/22 1401     (approximate)   History   Abdominal Cramping   HPI  April Clay is a 26 y.o. female  G3P3 who presents today for evaluation of abdominal cramps that felt consistent with her prior period cramps.  Patient reports that she was "not prepared" and that she did not have pads or tampons so she called out of work yesterday and today so she came to the emergency department for a work note.  Patient Active Problem List   Diagnosis Date Noted   Iron deficiency anemia 01/21/2019   Allergic rhinitis    Mild intermittent asthma    Anemia    ADHD           Physical Exam   Triage Vital Signs: ED Triage Vitals  Enc Vitals Group     BP 06/15/22 1357 (!) 122/58     Pulse Rate 06/15/22 1357 73     Resp 06/15/22 1357 16     Temp 06/15/22 1357 98.3 F (36.8 C)     Temp Source 06/15/22 1357 Oral     SpO2 06/15/22 1357 100 %     Weight --      Height --      Head Circumference --      Peak Flow --      Pain Score 06/15/22 1359 4     Pain Loc --      Pain Edu? --      Excl. in GC? --     Most recent vital signs: Vitals:   06/15/22 1357  BP: (!) 122/58  Pulse: 73  Resp: 16  Temp: 98.3 F (36.8 C)  SpO2: 100%    Physical Exam Vitals and nursing note reviewed.  Constitutional:      General: Awake and alert. No acute distress.    Appearance: Normal appearance. The patient is normal weight.  HENT:     Head: Normocephalic and atraumatic.     Mouth: Mucous membranes are moist.  Eyes:     General: PERRL. Normal EOMs        Right eye: No discharge.        Left eye: No discharge.     Conjunctiva/sclera: Conjunctivae normal.  Cardiovascular:     Rate and Rhythm: Normal rate and regular rhythm.     Pulses: Normal pulses.     Heart sounds: Normal heart sounds Pulmonary:     Effort: Pulmonary effort is normal. No respiratory distress.      Breath sounds: Normal breath sounds.  Abdominal:     Abdomen is soft. There is no abdominal tenderness. No rebound or guarding. No distention.  Musculoskeletal:        General: No swelling. Normal range of motion.     Cervical back: Normal range of motion and neck supple.  Skin:    General: Skin is warm and dry.     Capillary Refill: Capillary refill takes less than 2 seconds.     Findings: No rash.  Neurological:     Mental Status: The patient is awake and alert.      ED Results / Procedures / Treatments   Labs (all labs ordered are listed, but only abnormal results are displayed) Labs Reviewed  URINALYSIS, ROUTINE W REFLEX MICROSCOPIC - Abnormal; Notable for the following components:      Result Value   Color, Urine STRAW (*)  APPearance CLEAR (*)    All other components within normal limits  POC URINE PREG, ED     EKG     RADIOLOGY     PROCEDURES:  Critical Care performed:   Procedures   MEDICATIONS ORDERED IN ED: Medications  acetaminophen (TYLENOL) tablet 650 mg (650 mg Oral Given 06/15/22 1420)     IMPRESSION / MDM / ASSESSMENT AND PLAN / ED COURSE  I reviewed the triage vital signs and the nursing notes.   Differential diagnosis includes, but is not limited to, menstrual cramps, UTI, pregnancy, intra-abdominal infection or ovarian etiology.  Patient is awake alert, hemodynamically stable and afebrile.  She reports that she currently does not have any pain but when she does it is central and feels exactly same as her period cramps.  She does not want any work-up today, reports that she is only here because she needs a work note because she has a new job and does not want to lose her job for missing work.  She has no reproducible abdominal tenderness on exam.  Her urine pregnancy is negative and her urinalysis is normal.  She declined any further bloodwork/imaging.  She reports that she feels her normal self.  We discussed symptomatic management and  return precautions.  Her work note was given to her as she requested.  We discussed return precautions including heavy bleeding, worsening pain, nausea, vomiting, discharge, or symptoms that are not consistent with her normal menstrual period.   Patient's presentation is most consistent with acute illness / injury with system symptoms.      FINAL CLINICAL IMPRESSION(S) / ED DIAGNOSES   Final diagnoses:  Menstrual cramps     Rx / DC Orders   ED Discharge Orders     None        Note:  This document was prepared using Dragon voice recognition software and may include unintentional dictation errors.   Emeline Gins 06/15/22 1556    Rada Hay, MD 06/15/22 [phone]

## 2022-06-22 ENCOUNTER — Emergency Department
Admission: EM | Admit: 2022-06-22 | Discharge: 2022-06-22 | Disposition: A | Discharge status: Home / Self Care | Payer: Medicaid Other | Attending: Emergency Medicine | Admitting: Emergency Medicine

## 2022-06-22 ENCOUNTER — Emergency Department: Payer: Medicaid Other

## 2022-06-22 ENCOUNTER — Other Ambulatory Visit: Payer: Self-pay

## 2022-06-22 ENCOUNTER — Encounter: Payer: Self-pay | Admitting: Intensive Care

## 2022-06-22 DIAGNOSIS — J4520 Mild intermittent asthma, uncomplicated: Secondary | ICD-10-CM | POA: Insufficient documentation

## 2022-06-22 DIAGNOSIS — F439 Reaction to severe stress, unspecified: Secondary | ICD-10-CM | POA: Insufficient documentation

## 2022-06-22 DIAGNOSIS — E876 Hypokalemia: Secondary | ICD-10-CM | POA: Diagnosis not present

## 2022-06-22 DIAGNOSIS — R55 Syncope and collapse: Secondary | ICD-10-CM | POA: Insufficient documentation

## 2022-06-22 LAB — CBC
HCT: 32 % — ABNORMAL LOW (ref 36.0–46.0)
Hemoglobin: 10 g/dL — ABNORMAL LOW (ref 12.0–15.0)
MCH: 22.4 pg — ABNORMAL LOW (ref 26.0–34.0)
MCHC: 31.3 g/dL (ref 30.0–36.0)
MCV: 71.7 fL — ABNORMAL LOW (ref 80.0–100.0)
Platelets: 280 10*3/uL (ref 150–400)
RBC: 4.46 MIL/uL (ref 3.87–5.11)
RDW: 15.9 % — ABNORMAL HIGH (ref 11.5–15.5)
WBC: 7.5 10*3/uL (ref 4.0–10.5)
nRBC: 0 % (ref 0.0–0.2)

## 2022-06-22 LAB — BASIC METABOLIC PANEL
Anion gap: 10 (ref 5–15)
BUN: 10 mg/dL (ref 6–20)
CO2: 20 mmol/L — ABNORMAL LOW (ref 22–32)
Calcium: 9.1 mg/dL (ref 8.9–10.3)
Chloride: 108 mmol/L (ref 98–111)
Creatinine, Ser: 0.73 mg/dL (ref 0.44–1.00)
GFR, Estimated: >60 mL/min (ref >60)
Glucose, Bld: 87 mg/dL (ref 70–99)
Potassium: 2.9 mmol/L — ABNORMAL LOW (ref 3.5–5.1)
Sodium: 138 mmol/L (ref 135–145)

## 2022-06-22 LAB — TROPONIN I (HIGH SENSITIVITY)
Troponin I (High Sensitivity): 4 ng/L (ref <18)
Troponin I (High Sensitivity): 5 ng/L (ref <18)

## 2022-06-22 LAB — D-DIMER, QUANTITATIVE: D-Dimer, Quant: <0.27 ug/mL-FEU (ref 0.00–0.50)

## 2022-06-22 LAB — POC URINE PREG, ED: Preg Test, Ur: NEGATIVE

## 2022-06-22 MED ORDER — POTASSIUM CHLORIDE 20 MEQ PO PACK
40.0000 meq | PACK | Freq: Two times a day (BID) | ORAL | Status: DC
  Administered 2022-06-22: 40 meq via ORAL
  Filled 2022-06-22: qty 2

## 2022-06-22 NOTE — ED Provider Notes (Signed)
[facility] Provider Note    Event Date/Time   First MD Initiated Contact with Patient 06/22/22 1507     (approximate)   History   Near Syncope   HPI  April Clay is a 26 y.o. female with a past medical history of iron deficiency anemia, asthma who presents today for evaluation of shortness of breath, chest pain, and syncope.  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  She reports that she got tunnel vision and had to sit down and then she felt better.  She reports that she felt like she could not breathe and felt like she had pressure on her chest.  Reports that she is undergoing significant stress at home. This was approximately 7 hours prior to evaluation.  Patient reports that she currently feels back to normal.  She denies abdominal pain.  She reports that she is currently under a lot of stress and attributes her symptoms to this.  She has not had any calf pain or leg swelling.  She denies any exogenous hormone use.  Patient Active Problem List   Diagnosis Date Noted   Iron deficiency anemia 01/21/2019   Allergic rhinitis    Mild intermittent asthma    Anemia    ADHD           Physical Exam   Triage Vital Signs: ED Triage Vitals  Enc Vitals Group     BP 06/22/22 1355 122/76     Pulse Rate 06/22/22 1355 82     Resp 06/22/22 1355 16     Temp 06/22/22 1355 99 F (37.2 C)     Temp Source 06/22/22 1355 Oral     SpO2 06/22/22 1355 100 %     Weight 06/22/22 1350 125 lb (56.7 kg)     Height 06/22/22 1350 5\' 7"  (1.702 m)     Head Circumference --      Peak Flow --      Pain Score 06/22/22 1350 7     Pain Loc --      Pain Edu? --      Excl. in [place]? --     Most recent vital signs: Vitals:   06/22/22 1355  BP: 122/76  Pulse: 82  Resp: 16   Temp: 99 F (37.2 C)  SpO2: 100%    Physical Exam Vitals and nursing note reviewed.  Constitutional:      General: Awake and alert. No acute distress.    Appearance: Normal appearance. The patient is normal weight.  HENT:     Head: Normocephalic and atraumatic.     Mouth: Mucous membranes are moist.  Eyes:     General: PERRL. Normal EOMs        Right eye: No discharge.        Left eye: No discharge.     Conjunctiva/sclera: Conjunctivae normal.  Cardiovascular:     Rate and Rhythm: Normal rate and regular rhythm.     Pulses: Normal pulses. Equal in all four extremities    Heart sounds: Normal heart sounds Pulmonary:     Effort: Pulmonary effort is normal. No respiratory distress.     Breath sounds: Normal breath sounds.  Abdominal:     Abdomen is soft. There is no abdominal tenderness. No  rebound or guarding. No distention. Musculoskeletal:        General: No swelling. Normal range of motion.     Cervical back: Normal range of motion and neck supple.  Skin:    General: Skin is warm and dry.     Capillary Refill: Capillary refill takes less than 2 seconds.     Findings: No rash.  Neurological:     Mental Status: The patient is awake and alert.      ED Results / Procedures / Treatments   Labs (all labs ordered are listed, but only abnormal results are displayed) Labs Reviewed  BASIC METABOLIC PANEL - Abnormal; Notable for the following components:      Result Value   Potassium 2.9 (*)    CO2 20 (*)    All other components within normal limits  CBC - Abnormal; Notable for the following components:   Hemoglobin 10.0 (*)    HCT 32.0 (*)    MCV 71.7 (*)    MCH 22.4 (*)    RDW 15.9 (*)    All other components within normal limits  D-DIMER, QUANTITATIVE (NOT AT [facility])  POC URINE PREG, ED  TROPONIN I (HIGH SENSITIVITY)  TROPONIN I (HIGH SENSITIVITY)     EKG     RADIOLOGY I independently reviewed and interpreted imaging and agree with radiologists  findings.     PROCEDURES:  Critical Care performed:   Procedures   MEDICATIONS ORDERED IN ED: Medications  potassium chloride (KLOR-CON) packet 40 mEq (40 mEq Oral Given 06/22/22 1549)     IMPRESSION / MDM / ASSESSMENT AND PLAN / ED COURSE  I reviewed the triage vital signs and the nursing notes.   Differential diagnosis includes, but is not limited to, symptomatic anemia, electrolyte disarray, dehydration, ectopic pregnancy, pulmonary embolism.  Patient is awake and alert, hemodynamically stable and afebrile.  EKG obtained at triage demonstrates normal sinus rhythm, no ischemia, normal intervals.  She was placed on the cardiac monitor.  Labs obtained in triage are overall reassuring, H&H similar to previous (actually improved from most recent), though she was noted to be hypokalemic to 2.9.  This was repleted with potassium chloride.  Chest x-ray without cardiopulmonary abnormality, troponin x2 is negative, D-dimer is negative.  Urine pregnancy is negative.  Patient was reevaluated multiple times throughout her emergency department stay and reports that she feels back to baseline.  She requested a work note which was provided.  We discussed tricked return precautions and the importance of close outpatient follow-up.  Patient understands and agrees with plan.  She was discharged in stable condition.   Patient's presentation is most consistent with acute complicated illness / injury requiring diagnostic workup.   Clinical Course as of 06/22/22 Everrett Coombe Jun 22, 2022  1714 Patient re-evaluated. Watching TV, reports she feels much better. "I havent gotten to do this in a long time, this is wonderful." [JP]    Clinical Course User Index [JP] Abdullah Rizzi, Herb Grays, PA-C     FINAL CLINICAL IMPRESSION(S) / ED DIAGNOSES   Final diagnoses:  Near syncope  Hypokalemia     Rx / DC Orders   ED Discharge Orders     None        Note:  This document was prepared using Dragon voice  recognition software and may include unintentional dictation errors.   April Clay 06/22/22 Manuela Schwartz, MD 06/22/22 1900

## 2022-06-22 NOTE — ED Notes (Signed)
See triage note  Presents with h/a s/p syncopal episode  States she became SOB  used her inhalers

## 2022-06-22 NOTE — ED Notes (Signed)
Blue top recollected and sent to lab 

## 2022-06-22 NOTE — Discharge Instructions (Addendum)
Your potassium was slightly low, therefore this was repleted.  The rest of your blood work is normal.  Please refer to stay hydrated.  Please return for any new, worsening, or change in symptoms or other concerns.

## 2022-06-22 NOTE — ED Triage Notes (Signed)
Patient c/o near syncope and chest pain that started while at work today
